# Patient Record
Sex: Female | Born: 1975 | Race: White | Hispanic: Yes | Marital: Married | Smoking: Never smoker
Health system: Southern US, Community
[De-identification: ages and names within clinical notes are randomized; demographics above are authoritative.]

## PROBLEM LIST (undated history)

## (undated) DIAGNOSIS — E785 Hyperlipidemia, unspecified: Secondary | ICD-10-CM

## (undated) DIAGNOSIS — N809 Endometriosis, unspecified: Secondary | ICD-10-CM

## (undated) DIAGNOSIS — D689 Coagulation defect, unspecified: Secondary | ICD-10-CM

## (undated) DIAGNOSIS — K5900 Constipation, unspecified: Secondary | ICD-10-CM

## (undated) DIAGNOSIS — D6852 Prothrombin gene mutation: Secondary | ICD-10-CM

## (undated) DIAGNOSIS — E039 Hypothyroidism, unspecified: Secondary | ICD-10-CM

## (undated) DIAGNOSIS — E559 Vitamin D deficiency, unspecified: Secondary | ICD-10-CM

## (undated) DIAGNOSIS — J309 Allergic rhinitis, unspecified: Secondary | ICD-10-CM

## (undated) HISTORY — DX: Hypothyroidism, unspecified: E03.9

## (undated) HISTORY — DX: Hyperlipidemia, unspecified: E78.5

## (undated) HISTORY — DX: Constipation, unspecified: K59.00

## (undated) HISTORY — DX: Vitamin D deficiency, unspecified: E55.9

## (undated) HISTORY — DX: Endometriosis, unspecified: N80.9

## (undated) HISTORY — DX: Prothrombin gene mutation: D68.52

## (undated) HISTORY — DX: Allergic rhinitis, unspecified: J30.9

## (undated) HISTORY — PX: TUBAL LIGATION: SHX77

---

## 2005-12-22 HISTORY — PX: LAPAROSCOPIC ENDOMETRIOSIS FULGURATION: SUR769

## 2006-02-04 ENCOUNTER — Ambulatory Visit (HOSPITAL_COMMUNITY): Admission: RE | Admit: 2006-02-04 | Discharge: 2006-02-04 | Payer: Self-pay | Admitting: Obstetrics & Gynecology

## 2016-05-07 DIAGNOSIS — Z01419 Encounter for gynecological examination (general) (routine) without abnormal findings: Secondary | ICD-10-CM | POA: Diagnosis not present

## 2016-05-07 DIAGNOSIS — Z1231 Encounter for screening mammogram for malignant neoplasm of breast: Secondary | ICD-10-CM | POA: Diagnosis not present

## 2016-05-07 DIAGNOSIS — Z6825 Body mass index (BMI) 25.0-25.9, adult: Secondary | ICD-10-CM | POA: Diagnosis not present

## 2016-05-21 DIAGNOSIS — S93105A Unspecified dislocation of left toe(s), initial encounter: Secondary | ICD-10-CM | POA: Diagnosis not present

## 2016-05-23 DIAGNOSIS — S92515A Nondisplaced fracture of proximal phalanx of left lesser toe(s), initial encounter for closed fracture: Secondary | ICD-10-CM | POA: Diagnosis not present

## 2016-05-23 DIAGNOSIS — M79675 Pain in left toe(s): Secondary | ICD-10-CM | POA: Diagnosis not present

## 2016-08-13 DIAGNOSIS — K080 Exfoliation of teeth due to systemic causes: Secondary | ICD-10-CM | POA: Diagnosis not present

## 2016-09-02 DIAGNOSIS — Z13220 Encounter for screening for lipoid disorders: Secondary | ICD-10-CM | POA: Diagnosis not present

## 2016-09-02 DIAGNOSIS — Z0000 Encounter for general adult medical examination without abnormal findings: Secondary | ICD-10-CM | POA: Diagnosis not present

## 2016-09-02 DIAGNOSIS — Z23 Encounter for immunization: Secondary | ICD-10-CM | POA: Diagnosis not present

## 2016-09-02 DIAGNOSIS — D649 Anemia, unspecified: Secondary | ICD-10-CM | POA: Diagnosis not present

## 2016-09-02 DIAGNOSIS — E559 Vitamin D deficiency, unspecified: Secondary | ICD-10-CM | POA: Diagnosis not present

## 2017-03-09 DIAGNOSIS — K080 Exfoliation of teeth due to systemic causes: Secondary | ICD-10-CM | POA: Diagnosis not present

## 2017-04-29 DIAGNOSIS — L239 Allergic contact dermatitis, unspecified cause: Secondary | ICD-10-CM | POA: Diagnosis not present

## 2017-05-27 DIAGNOSIS — Z01419 Encounter for gynecological examination (general) (routine) without abnormal findings: Secondary | ICD-10-CM | POA: Diagnosis not present

## 2017-05-27 DIAGNOSIS — Z6826 Body mass index (BMI) 26.0-26.9, adult: Secondary | ICD-10-CM | POA: Diagnosis not present

## 2017-05-27 DIAGNOSIS — Z1231 Encounter for screening mammogram for malignant neoplasm of breast: Secondary | ICD-10-CM | POA: Diagnosis not present

## 2017-05-28 DIAGNOSIS — Z01419 Encounter for gynecological examination (general) (routine) without abnormal findings: Secondary | ICD-10-CM | POA: Diagnosis not present

## 2017-07-24 DIAGNOSIS — R0789 Other chest pain: Secondary | ICD-10-CM | POA: Diagnosis not present

## 2017-07-24 DIAGNOSIS — R61 Generalized hyperhidrosis: Secondary | ICD-10-CM | POA: Diagnosis not present

## 2017-07-30 DIAGNOSIS — R079 Chest pain, unspecified: Secondary | ICD-10-CM | POA: Diagnosis not present

## 2017-07-31 ENCOUNTER — Other Ambulatory Visit: Payer: Self-pay | Admitting: Cardiology

## 2017-07-31 DIAGNOSIS — R079 Chest pain, unspecified: Secondary | ICD-10-CM

## 2017-08-03 DIAGNOSIS — R079 Chest pain, unspecified: Secondary | ICD-10-CM | POA: Diagnosis not present

## 2017-08-06 DIAGNOSIS — R079 Chest pain, unspecified: Secondary | ICD-10-CM | POA: Diagnosis not present

## 2017-08-07 ENCOUNTER — Other Ambulatory Visit: Payer: Self-pay

## 2017-08-07 ENCOUNTER — Ambulatory Visit
Admission: RE | Admit: 2017-08-07 | Discharge: 2017-08-07 | Disposition: A | Discharge status: Home / Self Care | Payer: Federal, State, Local not specified - PPO | Source: Ambulatory Visit | Attending: Cardiology | Admitting: Cardiology

## 2017-08-07 DIAGNOSIS — K8020 Calculus of gallbladder without cholecystitis without obstruction: Secondary | ICD-10-CM | POA: Diagnosis not present

## 2017-08-07 DIAGNOSIS — R079 Chest pain, unspecified: Secondary | ICD-10-CM

## 2017-09-01 ENCOUNTER — Ambulatory Visit: Payer: Self-pay | Admitting: General Surgery

## 2017-09-01 DIAGNOSIS — K8020 Calculus of gallbladder without cholecystitis without obstruction: Secondary | ICD-10-CM | POA: Diagnosis not present

## 2017-09-16 DIAGNOSIS — K080 Exfoliation of teeth due to systemic causes: Secondary | ICD-10-CM | POA: Diagnosis not present

## 2017-09-24 NOTE — Patient Instructions (Signed)
Karina Mitchell  09/24/2017   Your procedure is scheduled on: 10-01-17  Report to [facility] Main  Entrance [place]  elevators to 3rd floor to  [place] at 530AM.   Call this number if you have problems the morning of surgery [phone]    Remember: ONLY 1 PERSON MAY GO WITH YOU TO SHORT STAY TO GET  READY MORNING OF [place].  Do not eat food or drink liquids :After Midnight.     Take these medicines the morning of surgery with A SIP OF WATER: allegra as needed, eye drops                                 You may not have any metal on your body including hair pins and              piercings  Do not wear jewelry, make-up, lotions, powders or perfumes, deodorant             Do not wear nail polish.  Do not shave  48 hours prior to surgery.               Do not bring valuables to the hospital. [place].  Contacts, dentures or bridgework may not be worn into surgery.     Patients discharged the day of surgery will not be allowed to drive home.  Name and phone number of your driver:  Special Instructions: N/A              Please read over the following fact sheets you were given: _____________________________________________________________________           [facility] - Preparing for Surgery Before surgery, you can play an important role.  Because skin is not sterile, your skin needs to be as free of germs as possible.  You can reduce the number of germs on your skin by washing with CHG (chlorahexidine gluconate) soap before surgery.  CHG is an antiseptic cleaner which kills germs and bonds with the skin to continue killing germs even after washing. Please DO NOT use if you have an allergy to CHG or antibacterial soaps.  If your skin becomes reddened/irritated stop using the CHG and inform your nurse when you arrive at Short Stay. Do not shave (including legs and underarms) for at least 48  hours prior to the first CHG shower.  You may shave your face/neck. Please follow these instructions carefully:  1.  Shower with CHG Soap the night before surgery and the  morning of Surgery.  2.  If you choose to wash your hair, wash your hair first as usual with your  normal  shampoo.  3.  After you shampoo, rinse your hair and body thoroughly to remove the  shampoo.                           4.  Use CHG as you would any other liquid soap.  You can apply chg directly  to the skin and wash                       Gently with a scrungie or clean washcloth.  5.  Apply the CHG Soap to your body ONLY FROM THE NECK DOWN.   Do not use on face/ open                           Wound or open sores. Avoid contact with eyes, ears mouth and genitals (private parts).                       Wash face,  Genitals (private parts) with your normal soap.             6.  Wash thoroughly, paying special attention to the area where your surgery  will be performed.  7.  Thoroughly rinse your body with warm water from the neck down.  8.  DO NOT shower/wash with your normal soap after using and rinsing off  the CHG Soap.                9.  Pat yourself dry with a clean towel.            10.  Wear clean pajamas.            11.  Place clean sheets on your bed the night of your first shower and do not  sleep with pets. Day of Surgery : Do not apply any lotions/deodorants the morning of surgery.  Please wear clean clothes to the hospital/surgery center.  FAILURE TO FOLLOW THESE INSTRUCTIONS MAY RESULT IN THE CANCELLATION OF YOUR SURGERY PATIENT SIGNATURE_________________________________  NURSE SIGNATURE__________________________________  ________________________________________________________________________

## 2017-09-24 NOTE — Progress Notes (Addendum)
[place] cardiology Dr Wynonia Lawman 08-06-17 on chart   [place] Dr Wynonia Lawman 07-30-17 on chart   EKG 07-30-17 on chart from Dr Wynonia Lawman office   Stress test 08-06-17 on chart from Dr Wynonia Lawman office   ECHO 08-03-17 on chart from Dr Wynonia Lawman office

## 2017-09-28 ENCOUNTER — Encounter (HOSPITAL_COMMUNITY)
Admission: RE | Admit: 2017-09-28 | Discharge: 2017-09-28 | Disposition: A | Discharge status: Home / Self Care | Payer: Federal, State, Local not specified - PPO | Source: Ambulatory Visit | Attending: General Surgery | Admitting: General Surgery

## 2017-09-28 ENCOUNTER — Encounter (HOSPITAL_COMMUNITY): Payer: Self-pay | Admitting: Emergency Medicine

## 2017-09-28 DIAGNOSIS — K8080 Other cholelithiasis without obstruction: Secondary | ICD-10-CM | POA: Diagnosis not present

## 2017-09-28 DIAGNOSIS — K8010 Calculus of gallbladder with chronic cholecystitis without obstruction: Secondary | ICD-10-CM | POA: Diagnosis not present

## 2017-09-28 HISTORY — DX: Coagulation defect, unspecified: D68.9

## 2017-09-28 LAB — CBC
HCT: 34 % — ABNORMAL LOW (ref 36.0–46.0)
HEMOGLOBIN: 11.3 g/dL — AB (ref 12.0–15.0)
MCH: 29 pg (ref 26.0–34.0)
MCHC: 33.2 g/dL (ref 30.0–36.0)
MCV: 87.4 fL (ref 78.0–100.0)
Platelets: 273 10*3/uL (ref 150–400)
RBC: 3.89 MIL/uL (ref 3.87–5.11)
RDW: 13.9 % (ref 11.5–15.5)
WBC: 5.5 10*3/uL (ref 4.0–10.5)

## 2017-09-28 LAB — HCG, SERUM, QUALITATIVE: PREG SERUM: NEGATIVE

## 2017-09-30 NOTE — Anesthesia Preprocedure Evaluation (Signed)
Anesthesia Evaluation  Patient identified by MRN, date of birth, ID band Patient awake    Reviewed: Allergy & Precautions, NPO status , Patient's Chart, lab work & pertinent test results  Airway Mallampati: II  TM Distance: >3 FB Neck ROM: Full    Dental no notable dental hx.    Pulmonary neg pulmonary ROS,    Pulmonary exam normal breath sounds clear to auscultation       Cardiovascular negative cardio ROS Normal cardiovascular exam Rhythm:Regular Rate:Normal     Neuro/Psych negative neurological ROS  negative psych ROS   GI/Hepatic negative GI ROS, Neg liver ROS,   Endo/Other  negative endocrine ROS  Renal/GU negative Renal ROS  negative genitourinary   Musculoskeletal negative musculoskeletal ROS (+)   Abdominal   Peds negative pediatric ROS (+)  Hematology negative hematology ROS (+)   Anesthesia Other Findings   Reproductive/Obstetrics negative OB ROS                             Anesthesia Physical Anesthesia Plan  ASA: II  Anesthesia Plan: General   Post-op Pain Management:    Induction: Intravenous  PONV Risk Score and Plan: 3 and Ondansetron, Dexamethasone, Midazolam, Treatment may vary due to age or medical condition and Scopolamine patch - Pre-op  Airway Management Planned: Oral ETT  Additional Equipment:   Intra-op Plan:   Post-operative Plan: Extubation in OR  Informed Consent:   Plan Discussed with:   Anesthesia Plan Comments: (  )        Anesthesia Quick Evaluation

## 2017-10-01 ENCOUNTER — Ambulatory Visit (HOSPITAL_COMMUNITY): Payer: Federal, State, Local not specified - PPO | Admitting: Anesthesiology

## 2017-10-01 ENCOUNTER — Encounter (HOSPITAL_COMMUNITY)
Admission: RE | Disposition: A | Discharge status: Home / Self Care | Payer: Self-pay | Source: Ambulatory Visit | Attending: General Surgery

## 2017-10-01 ENCOUNTER — Encounter (HOSPITAL_COMMUNITY): Payer: Self-pay | Admitting: *Deleted

## 2017-10-01 ENCOUNTER — Ambulatory Visit (HOSPITAL_COMMUNITY): Payer: Federal, State, Local not specified - PPO

## 2017-10-01 ENCOUNTER — Ambulatory Visit (HOSPITAL_COMMUNITY)
Admission: RE | Admit: 2017-10-01 | Discharge: 2017-10-01 | Disposition: A | Discharge status: Home / Self Care | Payer: Federal, State, Local not specified - PPO | Source: Ambulatory Visit | Attending: General Surgery | Admitting: General Surgery

## 2017-10-01 DIAGNOSIS — K8020 Calculus of gallbladder without cholecystitis without obstruction: Secondary | ICD-10-CM | POA: Diagnosis not present

## 2017-10-01 DIAGNOSIS — K8010 Calculus of gallbladder with chronic cholecystitis without obstruction: Secondary | ICD-10-CM | POA: Insufficient documentation

## 2017-10-01 DIAGNOSIS — K824 Cholesterolosis of gallbladder: Secondary | ICD-10-CM | POA: Diagnosis not present

## 2017-10-01 DIAGNOSIS — K8080 Other cholelithiasis without obstruction: Secondary | ICD-10-CM | POA: Diagnosis not present

## 2017-10-01 HISTORY — PX: CHOLECYSTECTOMY: SHX55

## 2017-10-01 SURGERY — LAPAROSCOPIC CHOLECYSTECTOMY WITH INTRAOPERATIVE CHOLANGIOGRAM
Anesthesia: General | Site: Abdomen

## 2017-10-01 MED ORDER — ONDANSETRON HCL 4 MG/2ML IJ SOLN
INTRAMUSCULAR | Status: DC | PRN
  Administered 2017-10-01 (×2): 2 mg via INTRAVENOUS

## 2017-10-01 MED ORDER — HYDROCODONE-ACETAMINOPHEN 5-325 MG PO TABS: 1.0000 | ORAL_TABLET | Freq: Four times a day (QID) | ORAL | Status: DC | PRN

## 2017-10-01 MED ORDER — ACETAMINOPHEN 500 MG PO TABS
1000.0000 mg | ORAL_TABLET | ORAL | Status: AC
  Administered 2017-10-01: 1000 mg via ORAL
  Filled 2017-10-01: qty 2

## 2017-10-01 MED ORDER — METOCLOPRAMIDE HCL 5 MG/ML IJ SOLN
INTRAMUSCULAR | Status: DC | PRN
  Administered 2017-10-01: 5 mg via INTRAVENOUS

## 2017-10-01 MED ORDER — DEXAMETHASONE SODIUM PHOSPHATE 10 MG/ML IJ SOLN
INTRAMUSCULAR | Status: AC
  Filled 2017-10-01: qty 1

## 2017-10-01 MED ORDER — METOCLOPRAMIDE HCL 5 MG/ML IJ SOLN
INTRAMUSCULAR | Status: AC
  Filled 2017-10-01: qty 2

## 2017-10-01 MED ORDER — SUGAMMADEX SODIUM 500 MG/5ML IV SOLN
INTRAVENOUS | Status: DC | PRN
  Administered 2017-10-01: 300 mg via INTRAVENOUS

## 2017-10-01 MED ORDER — SCOPOLAMINE 1 MG/3DAYS TD PT72
MEDICATED_PATCH | TRANSDERMAL | Status: AC
  Filled 2017-10-01: qty 1

## 2017-10-01 MED ORDER — CEFAZOLIN SODIUM-DEXTROSE 2-4 GM/100ML-% IV SOLN
INTRAVENOUS | Status: AC
  Filled 2017-10-01: qty 100

## 2017-10-01 MED ORDER — IOPAMIDOL (ISOVUE-300) INJECTION 61%
INTRAVENOUS | Status: DC | PRN
  Administered 2017-10-01: 11 mL

## 2017-10-01 MED ORDER — LIDOCAINE HCL (CARDIAC) 20 MG/ML IV SOLN
INTRAVENOUS | Status: DC | PRN
  Administered 2017-10-01: 80 mg via INTRAVENOUS

## 2017-10-01 MED ORDER — DEXAMETHASONE SODIUM PHOSPHATE 10 MG/ML IJ SOLN
INTRAMUSCULAR | Status: DC | PRN
  Administered 2017-10-01: 10 mg via INTRAVENOUS

## 2017-10-01 MED ORDER — PROPOFOL 10 MG/ML IV BOLUS
INTRAVENOUS | Status: DC | PRN
  Administered 2017-10-01: 150 mg via INTRAVENOUS
  Administered 2017-10-01: 50 mg via INTRAVENOUS

## 2017-10-01 MED ORDER — LACTATED RINGERS IV SOLN
INTRAVENOUS | Status: DC | PRN
  Administered 2017-10-01 (×2): via INTRAVENOUS

## 2017-10-01 MED ORDER — FENTANYL CITRATE (PF) 100 MCG/2ML IJ SOLN
INTRAMUSCULAR | Status: DC | PRN
  Administered 2017-10-01: 50 ug via INTRAVENOUS
  Administered 2017-10-01: 100 ug via INTRAVENOUS
  Administered 2017-10-01: 50 ug via INTRAVENOUS

## 2017-10-01 MED ORDER — ROCURONIUM BROMIDE 50 MG/5ML IV SOSY
PREFILLED_SYRINGE | INTRAVENOUS | Status: AC
  Filled 2017-10-01: qty 5

## 2017-10-01 MED ORDER — MIDAZOLAM HCL 2 MG/2ML IJ SOLN
INTRAMUSCULAR | Status: AC
  Filled 2017-10-01: qty 2

## 2017-10-01 MED ORDER — FENTANYL CITRATE (PF) 100 MCG/2ML IJ SOLN
INTRAMUSCULAR | Status: AC
  Filled 2017-10-01: qty 2

## 2017-10-01 MED ORDER — ACETAMINOPHEN 10 MG/ML IV SOLN
INTRAVENOUS | Status: AC
  Filled 2017-10-01: qty 100

## 2017-10-01 MED ORDER — CHLORHEXIDINE GLUCONATE CLOTH 2 % EX PADS: 6.0000 | MEDICATED_PAD | Freq: Once | CUTANEOUS | Status: DC

## 2017-10-01 MED ORDER — HYDROCODONE-ACETAMINOPHEN 5-325 MG PO TABS: 1.0000 | ORAL_TABLET | Freq: Four times a day (QID) | ORAL | 0 refills | Status: DC | PRN

## 2017-10-01 MED ORDER — BUPIVACAINE-EPINEPHRINE 0.25% -1:200000 IJ SOLN
INTRAMUSCULAR | Status: DC | PRN
Start: 2017-10-01 — End: 2017-10-01
  Administered 2017-10-01: 22 mL

## 2017-10-01 MED ORDER — ONDANSETRON HCL 4 MG/2ML IJ SOLN
INTRAMUSCULAR | Status: AC
  Filled 2017-10-01: qty 2

## 2017-10-01 MED ORDER — GABAPENTIN 300 MG PO CAPS
300.0000 mg | ORAL_CAPSULE | ORAL | Status: AC
Start: 2017-10-01 — End: 2017-10-01
  Administered 2017-10-01: 300 mg via ORAL
  Filled 2017-10-01: qty 1

## 2017-10-01 MED ORDER — BUPIVACAINE-EPINEPHRINE (PF) 0.25% -1:200000 IJ SOLN
INTRAMUSCULAR | Status: AC
  Filled 2017-10-01: qty 30

## 2017-10-01 MED ORDER — GLYCOPYRROLATE 0.2 MG/ML IV SOSY
PREFILLED_SYRINGE | INTRAVENOUS | Status: AC
  Filled 2017-10-01: qty 5

## 2017-10-01 MED ORDER — SCOPOLAMINE 1 MG/3DAYS TD PT72
MEDICATED_PATCH | TRANSDERMAL | Status: DC | PRN
  Administered 2017-10-01 (×2): 1 via TRANSDERMAL

## 2017-10-01 MED ORDER — ONDANSETRON HCL 4 MG/2ML IJ SOLN: 4.0000 mg | Freq: Once | INTRAMUSCULAR | Status: DC | PRN

## 2017-10-01 MED ORDER — MIDAZOLAM HCL 5 MG/5ML IJ SOLN
INTRAMUSCULAR | Status: DC | PRN
  Administered 2017-10-01 (×2): 1 mg via INTRAVENOUS

## 2017-10-01 MED ORDER — SUGAMMADEX SODIUM 200 MG/2ML IV SOLN
INTRAVENOUS | Status: AC
  Filled 2017-10-01: qty 2

## 2017-10-01 MED ORDER — EPHEDRINE SULFATE 50 MG/ML IJ SOLN
INTRAMUSCULAR | Status: DC | PRN
  Administered 2017-10-01: 5 mg via INTRAVENOUS

## 2017-10-01 MED ORDER — MEPERIDINE HCL 50 MG/ML IJ SOLN: 6.2500 mg | INTRAMUSCULAR | Status: DC | PRN

## 2017-10-01 MED ORDER — IOPAMIDOL (ISOVUE-300) INJECTION 61%
INTRAVENOUS | Status: AC
  Filled 2017-10-01: qty 50

## 2017-10-01 MED ORDER — CEFAZOLIN SODIUM-DEXTROSE 2-4 GM/100ML-% IV SOLN
2.0000 g | INTRAVENOUS | Status: AC
  Administered 2017-10-01: 2 g via INTRAVENOUS

## 2017-10-01 MED ORDER — PROPOFOL 10 MG/ML IV BOLUS
INTRAVENOUS | Status: AC
  Filled 2017-10-01: qty 20

## 2017-10-01 MED ORDER — EPHEDRINE 5 MG/ML INJ
INTRAVENOUS | Status: AC
  Filled 2017-10-01: qty 10

## 2017-10-01 MED ORDER — FENTANYL CITRATE (PF) 100 MCG/2ML IJ SOLN: 25.0000 ug | INTRAMUSCULAR | Status: DC | PRN

## 2017-10-01 MED ORDER — 0.9 % SODIUM CHLORIDE (POUR BTL) OPTIME
TOPICAL | Status: DC | PRN
  Administered 2017-10-01: 1000 mL

## 2017-10-01 MED ORDER — CELECOXIB 200 MG PO CAPS
400.0000 mg | ORAL_CAPSULE | ORAL | Status: AC
  Administered 2017-10-01: 400 mg via ORAL
  Filled 2017-10-01: qty 2

## 2017-10-01 MED ORDER — LACTATED RINGERS IR SOLN
Status: DC | PRN
  Administered 2017-10-01: 1000 mL

## 2017-10-01 MED ORDER — LIDOCAINE 2% (20 MG/ML) 5 ML SYRINGE
INTRAMUSCULAR | Status: AC
  Filled 2017-10-01: qty 5

## 2017-10-01 MED ORDER — GLYCOPYRROLATE 0.2 MG/ML IJ SOLN
INTRAMUSCULAR | Status: DC | PRN
  Administered 2017-10-01: 0.2 mg via INTRAVENOUS

## 2017-10-01 MED ORDER — ROCURONIUM BROMIDE 100 MG/10ML IV SOLN
INTRAVENOUS | Status: DC | PRN
  Administered 2017-10-01: 30 mg via INTRAVENOUS
  Administered 2017-10-01: 10 mg via INTRAVENOUS

## 2017-10-01 MED FILL — HYDROCODON-APAP 5-325: 5-325 | 2 days supply | Qty: 15 | Fill #0

## 2017-10-01 SURGICAL SUPPLY — 27 items
APPLIER CLIP 5 13 M/L LIGAMAX5 (MISCELLANEOUS) ×2
CABLE HIGH FREQUENCY MONO STRZ (ELECTRODE) ×2 IMPLANT
CATH REDDICK CHOLANGI 4FR 50CM (CATHETERS) ×2 IMPLANT
CHLORAPREP W/TINT 26ML (MISCELLANEOUS) ×2 IMPLANT
CLIP APPLIE 5 13 M/L LIGAMAX5 (MISCELLANEOUS) ×1 IMPLANT
COVER MAYO STAND STRL (DRAPES) ×2 IMPLANT
DECANTER SPIKE VIAL GLASS SM (MISCELLANEOUS) ×2 IMPLANT
DERMABOND ADVANCED (GAUZE/BANDAGES/DRESSINGS) ×1
DERMABOND ADVANCED .7 DNX12 (GAUZE/BANDAGES/DRESSINGS) ×1 IMPLANT
DRAPE C-ARM 42X120 X-RAY (DRAPES) ×2 IMPLANT
ELECT REM PT RETURN 15FT ADLT (MISCELLANEOUS) ×2 IMPLANT
GLOVE BIO SURGEON STRL SZ7.5 (GLOVE) ×2 IMPLANT
GOWN STRL REUS W/TWL XL LVL3 (GOWN DISPOSABLE) ×8 IMPLANT
HEMOSTAT SURGICEL 4X8 (HEMOSTASIS) IMPLANT
IV CATH 14GX2 1/4 (CATHETERS) ×2 IMPLANT
KIT BASIN OR (CUSTOM PROCEDURE TRAY) ×2 IMPLANT
POUCH SPECIMEN RETRIEVAL 10MM (ENDOMECHANICALS) ×2 IMPLANT
SCISSORS LAP 5X35 DISP (ENDOMECHANICALS) ×2 IMPLANT
SET IRRIG TUBING LAPAROSCOPIC (IRRIGATION / IRRIGATOR) ×2 IMPLANT
SLEEVE XCEL OPT CAN 5 100 (ENDOMECHANICALS) ×6 IMPLANT
SUT MNCRL AB 4-0 PS2 18 (SUTURE) ×2 IMPLANT
TOWEL OR 17X26 10 PK STRL BLUE (TOWEL DISPOSABLE) ×2 IMPLANT
TOWEL OR NON WOVEN STRL DISP B (DISPOSABLE) ×2 IMPLANT
TRAY LAPAROSCOPIC (CUSTOM PROCEDURE TRAY) ×2 IMPLANT
TROCAR BLADELESS OPT 5 100 (ENDOMECHANICALS) ×2 IMPLANT
TROCAR XCEL BLUNT TIP 100MML (ENDOMECHANICALS) ×2 IMPLANT
TUBING INSUF HEATED (TUBING) ×2 IMPLANT

## 2017-10-01 NOTE — Anesthesia Procedure Notes (Signed)
Procedure Name: Intubation Date/Time: 10/01/2017 7:41 AM Performed by: Janeece Riggers Pre-anesthesia Checklist: Emergency Drugs available, Suction available, Patient identified, Patient being monitored and Timeout performed Patient Re-evaluated:Patient Re-evaluated prior to induction Oxygen Delivery Method: Circle system utilized Preoxygenation: Pre-oxygenation with 100% oxygen Induction Type: IV induction and Cricoid Pressure applied Ventilation: Mask ventilation without difficulty Laryngoscope Size: Mac and 3 Grade View: Grade I Tube type: Oral Tube size: 7.0 mm Number of attempts: 1 Placement Confirmation: ETT inserted through vocal cords under direct vision,  breath sounds checked- equal and bilateral and positive ETCO2 Secured at: 21 cm Tube secured with: Tape Dental Injury: Teeth and Oropharynx as per pre-operative assessment

## 2017-10-01 NOTE — Discharge Instructions (Signed)
Scopolamine skin patches What is this medicine? SCOPOLAMINE (skoe POL a meen) is used to prevent nausea and vomiting caused by motion sickness, anesthesia and surgery. This medicine may be used for other purposes; ask your health care provider or pharmacist if you have questions. COMMON BRAND NAME(S): Transderm Scop What should I tell my health care provider before I take this medicine? They need to know if you have any of these conditions: -glaucoma -kidney or liver disease -an unusual or allergic reaction (especially skin allergy) to scopolamine, atropine, other medicines, foods, dyes, or preservatives -pregnant or trying to get pregnant -breast-feeding How should I use this medicine? This medicine is for external use only. Follow the directions on the prescription label. One patch contains enough medicine to prevent motion sickness for up to 3 days. Apply the patch at least 4 hours before you need it and only wear one disc at a time. Choose an area behind the ear, that is clean, dry, hairless and free from any cuts or irritation. Wipe the area with a clean dry tissue. Peel off the plastic backing of the skin patch, trying not to touch the adhesive side with your hands. Do not cut the patches. Firmly apply to the area you have chosen, with the metallic side of the patch to the skin and the tan-colored side showing. Once firmly in place, wash your hands well with soap and water. Remove the disc after 3 days, or sooner if you no longer need it. After removing the patch, wash your hands and the area behind your ear thoroughly with soap and water. The patch will still contain some medicine after use. To avoid accidental contact or ingestion by children or pets, fold the used patch in half with the sticky side together and throw away in the trash out of the reach of children and pets. If you need to use a second patch after you remove the first, place it behind the other ear. Talk to your pediatrician  regarding the use of this medicine in children. Special care may be needed. Overdosage: If you think you have taken too much of this medicine contact a poison control center or emergency room at once. NOTE: This medicine is only for you. Do not share this medicine with others. What if I miss a dose? Make sure you apply the patch at least 4 hours before you need it. You can apply it the night before traveling. What may interact with this medicine? -benztropine -bethanechol -medicines for anxiety or sleeping problems like diazepam or temazepam -medicines for hay fever and other allergies -medicines for mental depression -muscle relaxants This list may not describe all possible interactions. Give your health care provider a list of all the medicines, herbs, non-prescription drugs, or dietary supplements you use. Also tell them if you smoke, drink alcohol, or use illegal drugs. Some items may interact with your medicine. What should I watch for while using this medicine? Keep the patch dry, if possible, to prevent it from falling off. Limited contact with water, however, as in bathing or swimming, will not affect the system. If the patch falls off, throw it away and put a new one behind the other ear. You may get drowsy or dizzy. Do not drive, use machinery, or do anything that needs mental alertness until you know how this medicine affects you. Do not stand or sit up quickly, especially if you are an older patient. This reduces the risk of dizzy or fainting spells. Alcohol may interfere  with the effect of this medicine. Avoid alcoholic drinks. Your mouth may get dry. Chewing sugarless gum or sucking hard candy, and drinking plenty of water may help. Contact your doctor if the problem does not go away or is severe. This medicine may cause dry eyes and blurred vision. If you wear contact lenses you may feel some discomfort. Lubricating drops may help. See your eye doctor if the problem does not go away  or is severe. If you are going to have a magnetic resonance imaging (MRI) procedure, tell your MRI technician if you have this patch on your body. It must be removed before a MRI. What side effects may I notice from receiving this medicine? Side effects that you should report to your doctor or health care professional as soon as possible: -agitation, nervousness, confusion -blurred vision and other eye problems -dizziness, drowsiness -eye pain or redness in the whites of the eye -hallucinations -pain or difficulty passing urine -skin rash, itching -vomiting Side effects that usually do not require medical attention (report to your doctor or health care professional if they continue or are bothersome): -headache -nausea This list may not describe all possible side effects. Call your doctor for medical advice about side effects. You may report side effects to FDA at 1-800-FDA-1088. Where should I keep my medicine? Keep out of the reach of children. Store at room temperature between 20 and 25 degrees C (68 and 77 degrees F). Throw away any unused medicine after the expiration date. When you remove a patch, fold it and throw it in the trash as described above. NOTE: This sheet is a summary. It may not cover all possible information. If you have questions about this medicine, talk to your doctor, pharmacist, or health care provider.  2018 Elsevier/Gold Standard (2012-05-06 13:31:48)   General Anesthesia, Adult, Care After These instructions provide you with information about caring for yourself after your procedure. Your health care provider may also give you more specific instructions. Your treatment has been planned according to current medical practices, but problems sometimes occur. Call your health care provider if you have any problems or questions after your procedure. What can I expect after the procedure? After the procedure, it is common to have:  Vomiting.  A sore  throat.  Mental slowness.  It is common to feel:  Nauseous.  Cold or shivery.  Sleepy.  Tired.  Sore or achy, even in parts of your body where you did not have surgery.  Follow these instructions at home: For at least 24 hours after the procedure:  Do not: ? Participate in activities where you could fall or become injured. ? Drive. ? Use heavy machinery. ? Drink alcohol. ? Take sleeping pills or medicines that cause drowsiness. ? Make important decisions or sign legal documents. ? Take care of children on your own.  Rest. Eating and drinking  If you vomit, drink water, juice, or soup when you can drink without vomiting.  Drink enough fluid to keep your urine clear or pale yellow.  Make sure you have little or no nausea before eating solid foods.  Follow the diet recommended by your health care provider. General instructions  Have a responsible adult stay with you until you are awake and alert.  Return to your normal activities as told by your health care provider. Ask your health care provider what activities are safe for you.  Take over-the-counter and prescription medicines only as told by your health care provider.  If you smoke,  do not smoke without supervision.  Keep all follow-up visits as told by your health care provider. This is important. Contact a health care provider if:  You continue to have nausea or vomiting at home, and medicines are not helpful.  You cannot drink fluids or start eating again.  You cannot urinate after 8-12 hours.  You develop a skin rash.  You have fever.  You have increasing redness at the site of your procedure. Get help right away if:  You have difficulty breathing.  You have chest pain.  You have unexpected bleeding.  You feel that you are having a life-threatening or urgent problem. This information is not intended to replace advice given to you by your health care provider. Make sure you discuss any  questions you have with your health care provider. Document Released: 03/16/2001 Document Revised: 05/12/2016 Document Reviewed: 11/22/2015 Elsevier Interactive Patient Education  [employer].

## 2017-10-01 NOTE — Interval H&P Note (Signed)
History and Physical Interval Note:  10/01/2017 7:32 AM  Karina Mitchell  has presented today for surgery, with the diagnosis of GALLSTONES  The various methods of treatment have been discussed with the patient and family. After consideration of risks, benefits and other options for treatment, the patient has consented to  Procedure(s): LAPAROSCOPIC CHOLECYSTECTOMY WITH INTRAOPERATIVE CHOLANGIOGRAM (N/A) as a surgical intervention .  The patient's history has been reviewed, patient examined, no change in status, stable for surgery.  I have reviewed the patient's chart and labs.  Questions were answered to the patient's satisfaction.     TOTH III,PAUL S

## 2017-10-01 NOTE — H&P (Signed)
Karina Mitchell  Location: [facility] Surgery Patient #: [id number] DOB: 1976/07/07 Married / Language: English / Race: White Female   History of Present Illness  The patient is a 41 year old female who presents with abdominal pain. We are asked to see the patient in consultation by Dr. Marilynne Drivers to evaluate her for gallstones. The patient is a 41 year old white female who has had 3 episodes of severe epigastric pain in the last year. The most recent episode occurred on August 3. The pain is always in the epigastric and substernal region. The last time she had this it lasted about an hour before resolving. The pain was associated with some nausea but no vomiting. After this she underwent an ultrasound which did show multiple stones in the gallbladder but no gallbladder wall thickening or ductal dilatation. Her liver functions were normal.   Past Surgical History Cesarean Section - Multiple   Diagnostic Studies History  Colonoscopy  never Mammogram  within last year Pap Smear  1-5 years ago  Allergies  No Known Drug Allergies   Medication History  Naproxen (500MG  Tablet, Oral) Active. Allegra Allergy (Oral) Specific strength unknown - Active. Medications Reconciled  Social History Alcohol use  Occasional alcohol use. Caffeine use  Carbonated beverages, Coffee, Tea. No drug use  Tobacco use  Never smoker.  Family History Alcohol Abuse  Brother, Father. Cerebrovascular Accident  Father. Colon Polyps  Son. Hypertension  Father, Sister. Migraine Headache  Sister.  Pregnancy / Birth History Age at menarche  1 years. Contraceptive History  Oral contraceptives. Gravida  2 Length (months) of breastfeeding  12-24 Maternal age  11-35 Para  2 Regular periods   Other Problems Cholelithiasis  Hemorrhoids     Review of Systems General Not Present- Appetite Loss, Chills, Fatigue, Fever, Night Sweats, Weight Gain and Weight Loss. Skin  Not Present- Change in Wart/Mole, Dryness, Hives, Jaundice, New Lesions, Non-Healing Wounds, Rash and Ulcer. HEENT Present- Wears glasses/contact lenses. Not Present- Earache, Hearing Loss, Hoarseness, Nose Bleed, Oral Ulcers, Ringing in the Ears, Seasonal Allergies, Sinus Pain, Sore Throat, Visual Disturbances and Yellow Eyes. Respiratory Not Present- Bloody sputum, Chronic Cough, Difficulty Breathing, Snoring and Wheezing. Cardiovascular Not Present- Chest Pain, Difficulty Breathing Lying Down, Leg Cramps, Palpitations, Rapid Heart Rate, Shortness of Breath and Swelling of Extremities. Gastrointestinal Present- Abdominal Pain. Not Present- Bloating, Bloody Stool, Change in Bowel Habits, Chronic diarrhea, Constipation, Difficulty Swallowing, Excessive gas, Gets full quickly at meals, Hemorrhoids, Indigestion, Nausea, Rectal Pain and Vomiting. Female Genitourinary Not Present- Frequency, Nocturia, Painful Urination, Pelvic Pain and Urgency. Musculoskeletal Not Present- Back Pain, Joint Pain, Joint Stiffness, Muscle Pain, Muscle Weakness and Swelling of Extremities. Neurological Not Present- Decreased Memory, Fainting, Headaches, Numbness, Seizures, Tingling, Tremor, Trouble walking and Weakness. Psychiatric Not Present- Anxiety, Bipolar, Change in Sleep Pattern, Depression, Fearful and Frequent crying. Endocrine Not Present- Cold Intolerance, Excessive Hunger, Hair Changes, Heat Intolerance, Hot flashes and New Diabetes. Hematology Not Present- Blood Thinners, Easy Bruising, Excessive bleeding, Gland problems, HIV and Persistent Infections.  Vitals Weight: 140.38 lb Height: 61in Body Surface Area: 1.63 m Body Mass Index: 26.52 kg/m  Temp.: 98.66F(Oral)  Pulse: 64 (Regular)  BP: 98/74 (Sitting, Left Arm, Standard)       Physical Exam  General Mental Status-Alert. General Appearance-Consistent with stated age. Hydration-Well hydrated. Voice-Normal.  Head and  Neck Head-normocephalic, atraumatic with no lesions or palpable masses. Trachea-midline. Thyroid Gland Characteristics - normal size and consistency.  Eye Eyeball - Bilateral-Extraocular movements intact. Sclera/Conjunctiva - Bilateral-No  scleral icterus.  Chest and Lung Exam Chest and lung exam reveals -quiet, even and easy respiratory effort with no use of accessory muscles and on auscultation, normal breath sounds, no adventitious sounds and normal vocal resonance. Inspection Chest Wall - Normal. Back - normal.  Cardiovascular Cardiovascular examination reveals -normal heart sounds, regular rate and rhythm with no murmurs and normal pedal pulses bilaterally.  Abdomen Inspection Inspection of the abdomen reveals - No Hernias. Skin - Scar - no surgical scars. Palpation/Percussion Palpation and Percussion of the abdomen reveal - Soft, Non Tender, No Rebound tenderness, No Rigidity (guarding) and No hepatosplenomegaly. Auscultation Auscultation of the abdomen reveals - Bowel sounds normal.  Neurologic Neurologic evaluation reveals -alert and oriented x 3 with no impairment of recent or remote memory. Mental Status-Normal.  Musculoskeletal Normal Exam - Left-Upper Extremity Strength Normal and Lower Extremity Strength Normal. Normal Exam - Right-Upper Extremity Strength Normal and Lower Extremity Strength Normal.  Lymphatic Head & Neck  General Head & Neck Lymphatics: Bilateral - Description - Normal. Axillary  General Axillary Region: Bilateral - Description - Normal. Tenderness - Non Tender. Femoral & Inguinal  Generalized Femoral & Inguinal Lymphatics: Bilateral - Description - Normal. Tenderness - Non Tender.    Assessment & Plan GALLSTONES (K80.20) Impression: The patient appears to have symptomatic gallstones. Because of the risk of further painful episodes and possible pancreatitis and think she would benefit from having her gallbladder  removed. She would also like to have this done. I have discussed with her in detail the risks and benefits of the operation as well as some of the technical aspects and she understands and wishes to proceed Current Plans Pt Education - Gallstones: discussed with patient and provided information.

## 2017-10-01 NOTE — Transfer of Care (Signed)
Immediate Anesthesia Transfer of Care Note  Patient: Karina Mitchell  Procedure(s) Performed: LAPAROSCOPIC CHOLECYSTECTOMY WITH INTRAOPERATIVE CHOLANGIOGRAM (N/A Abdomen)  Patient Location: PACU  Anesthesia Type:General  Level of Consciousness: awake, sedated, patient cooperative, lethargic and responds to stimulation  Airway & Oxygen Therapy: Patient Spontanous Breathing and Patient connected to face mask oxygen  Post-op Assessment: Report given to RN, Post -op Vital signs reviewed and stable and Patient moving all extremities  Post vital signs: Reviewed and stable  Last Vitals:  Vitals:   10/01/17 0531 10/01/17 0910  BP: 115/72 (!) 110/58  Pulse: 70 94  Resp: 16   Temp: 36.8 C   SpO2: 99% 100%    Last Pain:  Vitals:   10/01/17 0531  TempSrc: Oral      Patients Stated Pain Goal: 4 (61/95/09 [id number])  Complications: No apparent anesthesia complications

## 2017-10-01 NOTE — Op Note (Signed)
10/01/2017  9:03 AM  PATIENT:  Karina Mitchell  41 y.o. female  PRE-OPERATIVE DIAGNOSIS:  GALLSTONES  POST-OPERATIVE DIAGNOSIS:  GALLSTONES  PROCEDURE:  Procedure(s): LAPAROSCOPIC CHOLECYSTECTOMY WITH INTRAOPERATIVE CHOLANGIOGRAM (N/A)  SURGEON:  Surgeon(s) and Role:    * Jovita Kussmaul, MD - Primary  PHYSICIAN ASSISTANT:   ASSISTANTS: none   ANESTHESIA:   local and general  EBL:  No intake/output data recorded.  BLOOD ADMINISTERED:none  DRAINS: none   LOCAL MEDICATIONS USED:  MARCAINE     SPECIMEN:  Source of Specimen:  gallbladder  DISPOSITION OF SPECIMEN:  PATHOLOGY  COUNTS:  YES  TOURNIQUET:  * No tourniquets in log *  DICTATION: .Dragon Dictation   Procedure: After informed consent was obtained the patient was brought to the operating room and placed in the supine position on the operating room table. After adequate induction of general anesthesia the patient's abdomen was prepped with ChloraPrep allowed to dry and draped in usual sterile manner. An appropriate timeout was performed. The area below the umbilicus was infiltrated with quarter percent  Marcaine. A small incision was made with a 15 blade knife. The incision was carried down through the subcutaneous tissue bluntly with a hemostat and [employer] retractors. The linea alba was identified. The linea alba was incised with a 15 blade knife and each side was grasped with Coker clamps. The preperitoneal space was then probed with a hemostat until the peritoneum was opened and access was gained to the abdominal cavity. A 0 Vicryl pursestring stitch was placed in the fascia surrounding the opening. A Hassan cannula was then placed through the opening and anchored in place with the previously placed Vicryl purse string stitch. The abdomen was insufflated with carbon dioxide without difficulty. A laparoscope was inserted through the [facility] cannula in the right upper quadrant was inspected. Next the epigastric region was  infiltrated with % Marcaine. A small incision was made with a 15 blade knife. A 5 mm port was placed bluntly through this incision into the abdominal cavity under direct vision. Next 2 sites were chosen laterally on the right side of the abdomen for placement of 5 mm ports. Each of these areas was infiltrated with quarter percent Marcaine. Small stab incisions were made with a 15 blade knife. 5 mm ports were then placed bluntly through these incisions into the abdominal cavity under direct vision without difficulty. A blunt grasper was placed through the lateralmost 5 mm port and used to grasp the dome of the gallbladder and elevated anteriorly and superiorly. Another blunt grasper was placed through the other 5 mm port and used to retract the body and neck of the gallbladder. A dissector was placed through the epigastric port and using the electrocautery the peritoneal reflection at the gallbladder neck was opened. Blunt dissection was then carried out in this area until the gallbladder neck-cystic duct junction was readily identified and a good window was created. A single clip was placed on the gallbladder neck. A small  ductotomy was made just below the clip with laparoscopic scissors. A 14-gauge Angiocath was then placed through the anterior abdominal wall under direct vision. A Reddick cholangiogram catheter was then placed through the Angiocath and flushed. The catheter was then placed in the cystic duct and anchored in place with a clip. A cholangiogram was obtained that showed no filling defects good emptying into the duodenum an adequate length on the cystic duct. The anchoring clip and catheters were then removed from the patient. 3 clips were  placed proximally on the cystic duct and the duct was divided between the 2 sets of clips. Posterior to this the cystic artery was identified and again dissected bluntly in a circumferential manner until a good window  was created. 2 clips were placed proximally  and one distally on the artery and the artery was divided between the 2 sets of clips. Next a laparoscopic hook cautery device was used to separate the gallbladder from the liver bed. Prior to completely detaching the gallbladder from the liver bed the liver bed was inspected and several small bleeding points were coagulated with the electrocautery until the area was completely hemostatic. The gallbladder was then detached the rest of it from the liver bed without difficulty. A laparoscopic bag was inserted through the hassan port. The laparoscope was moved to the epigastric port. The gallbladder was placed within the bag and the bag was sealed.  The bag with the gallbladder was then removed with the [facility] cannula through the infraumbilical port without difficulty. The fascial defect was then closed with the previously placed Vicryl pursestring stitch as well as with another figure-of-eight 0 Vicryl stitch. The liver bed was inspected again and found to be hemostatic. The abdomen was irrigated with copious amounts of saline until the effluent was clear. The ports were then removed under direct vision without difficulty and were found to be hemostatic. The gas was allowed to escape. The skin incisions were all closed with interrupted 4-0 Monocryl subcuticular stitches. Dermabond dressings were applied. The patient tolerated the procedure well. At the end of the case all needle sponge and instrument counts were correct. The patient was then awakened and taken to recovery in stable condition  PLAN OF CARE: Discharge to home after PACU  PATIENT DISPOSITION:  PACU - hemodynamically stable.   Delay start of Pharmacological VTE agent (>24hrs) due to surgical blood loss or risk of bleeding: not applicable

## 2017-10-01 NOTE — Anesthesia Postprocedure Evaluation (Signed)
Anesthesia Post Note  Patient: Karina Mitchell  Procedure(s) Performed: LAPAROSCOPIC CHOLECYSTECTOMY WITH INTRAOPERATIVE CHOLANGIOGRAM (N/A Abdomen)     Anesthesia Type: General    Last Vitals:  Vitals:   10/01/17 1000 10/01/17 1018  BP: 102/63 (!) 101/54  Pulse: 66   Resp: 15 16  Temp: 36.7 C 36.8 C  SpO2: 100% 100%    Last Pain:  Vitals:   10/01/17 1028  TempSrc:   PainSc: 2                  Panda Crossin

## 2017-10-02 ENCOUNTER — Encounter (HOSPITAL_COMMUNITY): Payer: Self-pay | Admitting: General Surgery

## 2018-03-22 DIAGNOSIS — K080 Exfoliation of teeth due to systemic causes: Secondary | ICD-10-CM | POA: Diagnosis not present

## 2018-06-23 DIAGNOSIS — Z3202 Encounter for pregnancy test, result negative: Secondary | ICD-10-CM | POA: Diagnosis not present

## 2018-06-23 DIAGNOSIS — Z01419 Encounter for gynecological examination (general) (routine) without abnormal findings: Secondary | ICD-10-CM | POA: Diagnosis not present

## 2018-06-23 DIAGNOSIS — Z01411 Encounter for gynecological examination (general) (routine) with abnormal findings: Secondary | ICD-10-CM | POA: Diagnosis not present

## 2018-06-23 DIAGNOSIS — Z1231 Encounter for screening mammogram for malignant neoplasm of breast: Secondary | ICD-10-CM | POA: Diagnosis not present

## 2018-06-23 DIAGNOSIS — Z6826 Body mass index (BMI) 26.0-26.9, adult: Secondary | ICD-10-CM | POA: Diagnosis not present

## 2018-06-23 DIAGNOSIS — N925 Other specified irregular menstruation: Secondary | ICD-10-CM | POA: Diagnosis not present

## 2018-08-31 DIAGNOSIS — D649 Anemia, unspecified: Secondary | ICD-10-CM | POA: Diagnosis not present

## 2018-08-31 DIAGNOSIS — Z0000 Encounter for general adult medical examination without abnormal findings: Secondary | ICD-10-CM | POA: Diagnosis not present

## 2018-08-31 DIAGNOSIS — E559 Vitamin D deficiency, unspecified: Secondary | ICD-10-CM | POA: Diagnosis not present

## 2018-08-31 DIAGNOSIS — Z23 Encounter for immunization: Secondary | ICD-10-CM | POA: Diagnosis not present

## 2018-08-31 DIAGNOSIS — Z136 Encounter for screening for cardiovascular disorders: Secondary | ICD-10-CM | POA: Diagnosis not present

## 2018-09-27 DIAGNOSIS — K080 Exfoliation of teeth due to systemic causes: Secondary | ICD-10-CM | POA: Diagnosis not present

## 2018-09-29 DIAGNOSIS — K111 Hypertrophy of salivary gland: Secondary | ICD-10-CM | POA: Diagnosis not present

## 2018-09-29 DIAGNOSIS — R0989 Other specified symptoms and signs involving the circulatory and respiratory systems: Secondary | ICD-10-CM | POA: Diagnosis not present

## 2018-10-01 ENCOUNTER — Other Ambulatory Visit: Payer: Self-pay | Admitting: Otolaryngology

## 2018-10-01 DIAGNOSIS — R198 Other specified symptoms and signs involving the digestive system and abdomen: Secondary | ICD-10-CM

## 2018-10-01 DIAGNOSIS — R0989 Other specified symptoms and signs involving the circulatory and respiratory systems: Secondary | ICD-10-CM

## 2018-10-01 DIAGNOSIS — K111 Hypertrophy of salivary gland: Secondary | ICD-10-CM

## 2018-10-06 ENCOUNTER — Ambulatory Visit
Admission: RE | Admit: 2018-10-06 | Discharge: 2018-10-06 | Disposition: A | Discharge status: Home / Self Care | Payer: Federal, State, Local not specified - PPO | Source: Ambulatory Visit | Attending: Otolaryngology | Admitting: Otolaryngology

## 2018-10-06 ENCOUNTER — Other Ambulatory Visit: Payer: Self-pay | Admitting: Otolaryngology

## 2018-10-06 DIAGNOSIS — R198 Other specified symptoms and signs involving the digestive system and abdomen: Secondary | ICD-10-CM

## 2018-10-06 DIAGNOSIS — R0989 Other specified symptoms and signs involving the circulatory and respiratory systems: Secondary | ICD-10-CM

## 2018-10-06 DIAGNOSIS — K111 Hypertrophy of salivary gland: Secondary | ICD-10-CM

## 2018-10-06 MED ORDER — IOPAMIDOL (ISOVUE-300) INJECTION 61%
75.0000 mL | Freq: Once | INTRAVENOUS | Status: AC | PRN
  Administered 2018-10-06: 75 mL via INTRAVENOUS

## 2018-10-07 ENCOUNTER — Telehealth: Payer: Self-pay | Admitting: Nurse Practitioner

## 2018-10-07 NOTE — Telephone Encounter (Incomplete)
Phone call to patient to review instructions for 13hr prep for CT scan with contrast on 10/18 at 11:00am.

## 2018-10-08 ENCOUNTER — Ambulatory Visit
Admission: RE | Admit: 2018-10-08 | Discharge: 2018-10-08 | Disposition: A | Discharge status: Home / Self Care | Payer: Federal, State, Local not specified - PPO | Source: Ambulatory Visit | Attending: Otolaryngology | Admitting: Otolaryngology

## 2018-10-08 DIAGNOSIS — K111 Hypertrophy of salivary gland: Secondary | ICD-10-CM | POA: Diagnosis not present

## 2018-10-08 DIAGNOSIS — R198 Other specified symptoms and signs involving the digestive system and abdomen: Secondary | ICD-10-CM

## 2018-10-08 DIAGNOSIS — R0989 Other specified symptoms and signs involving the circulatory and respiratory systems: Secondary | ICD-10-CM

## 2018-10-08 MED ORDER — IOPAMIDOL (ISOVUE-300) INJECTION 61%
75.0000 mL | Freq: Once | INTRAVENOUS | Status: AC | PRN
  Administered 2018-10-08: 75 mL via INTRAVENOUS

## 2018-10-28 IMAGING — US US ABDOMEN COMPLETE
1 series · 14 of 25 positions shown · non-contrast
Comparison: None.

CLINICAL DATA: Nausea

EXAM:
ABDOMEN ULTRASOUND COMPLETE

[Series 1: us abdomen complete · 0.15mm/px · 14 of 86 slices shown]
[im 1/86]
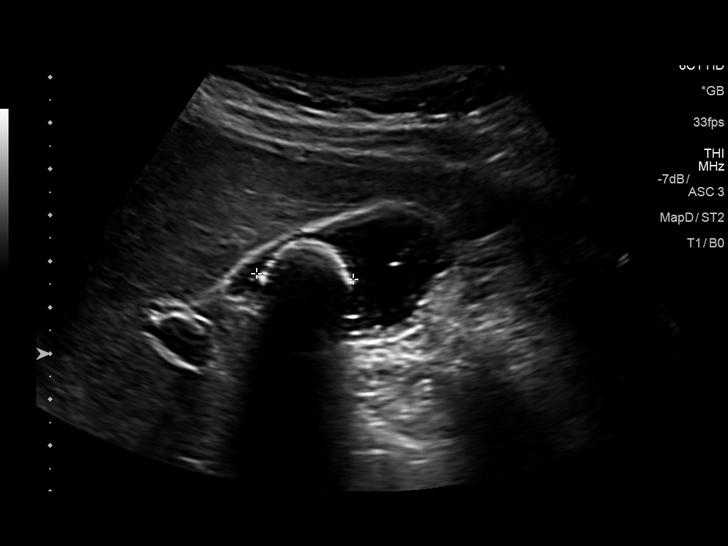
[im 8/86]
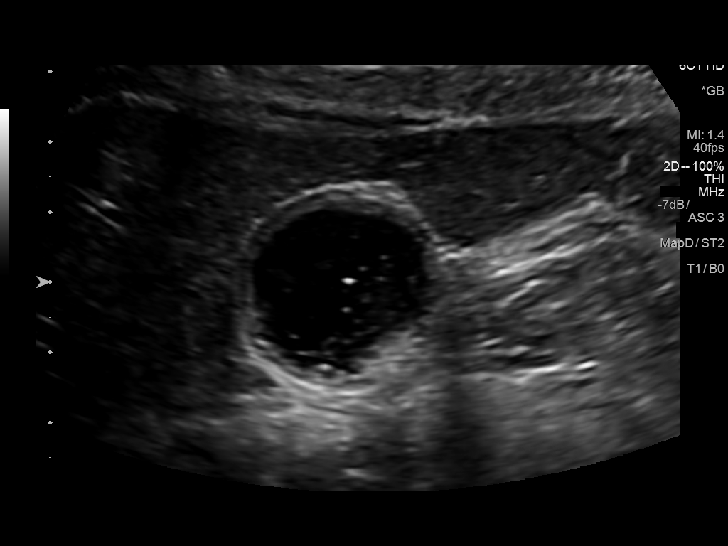
[im 15/86]
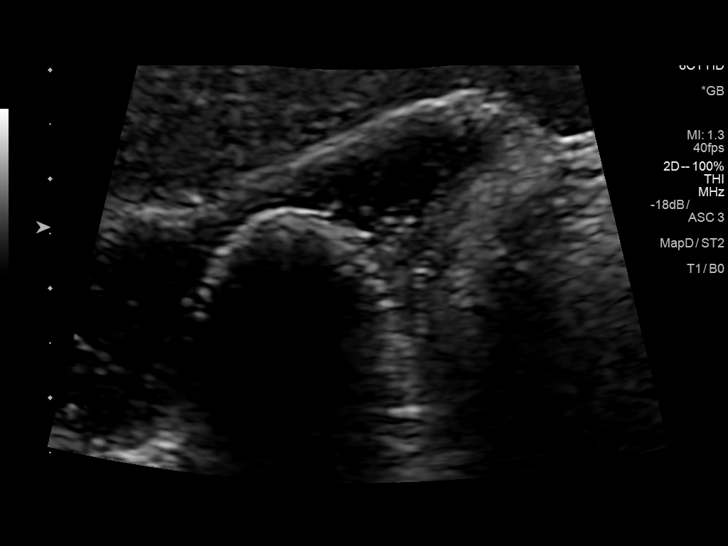
[im 22/86]
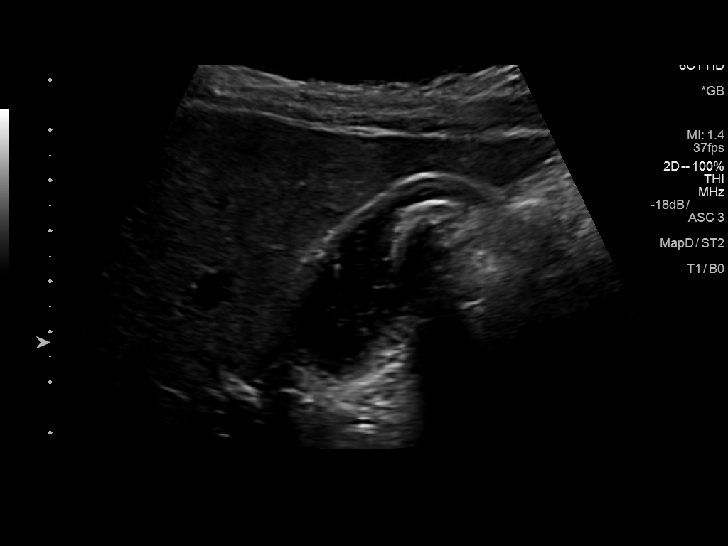
[im 29/86]
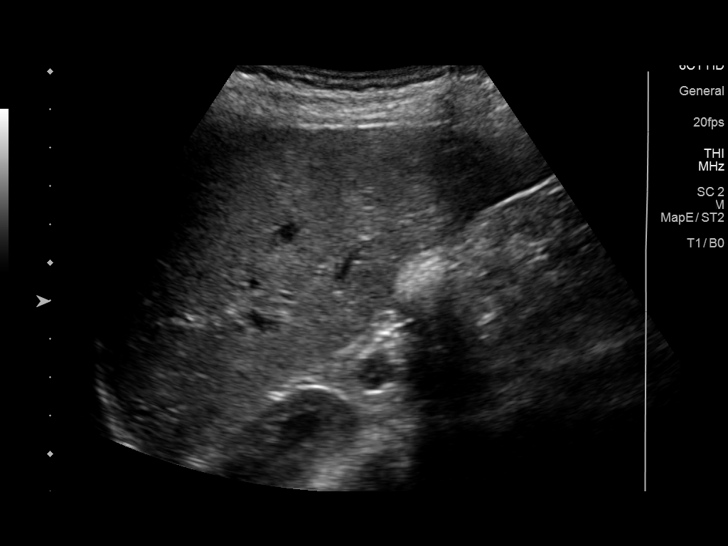
[im 32/86]
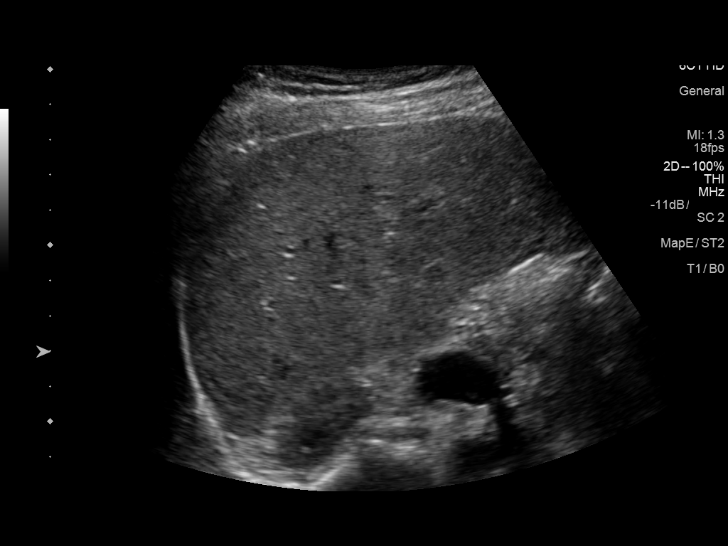
[im 39/86]
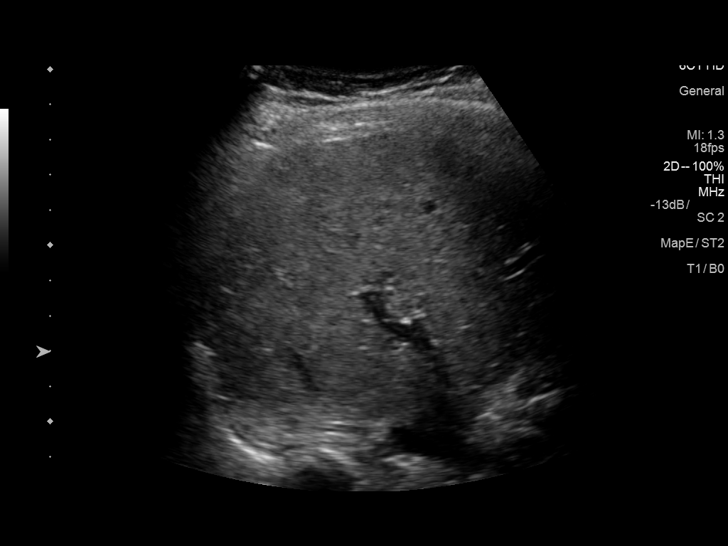
[im 47/86]
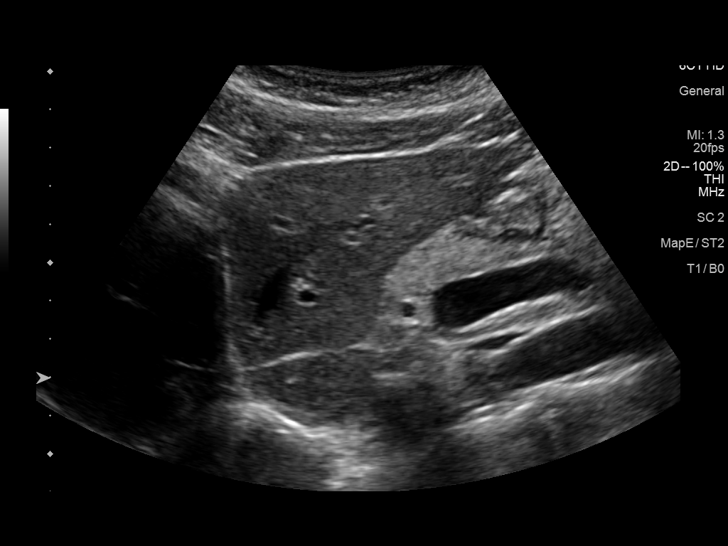
[im 54/86]
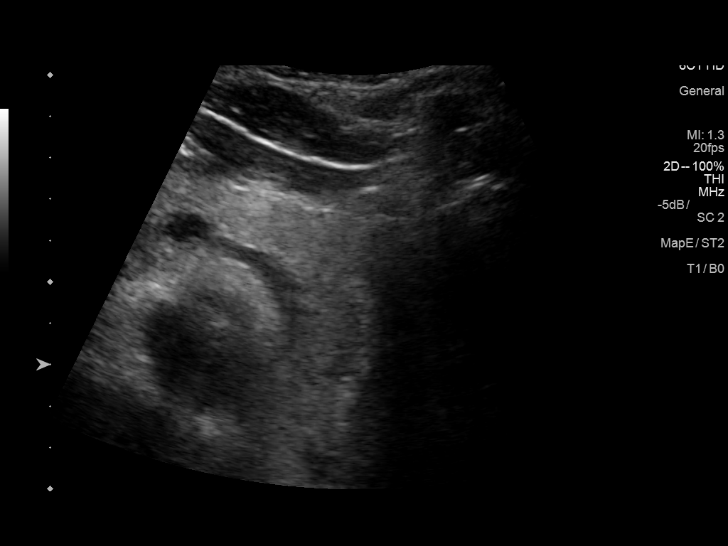
[im 57/86]
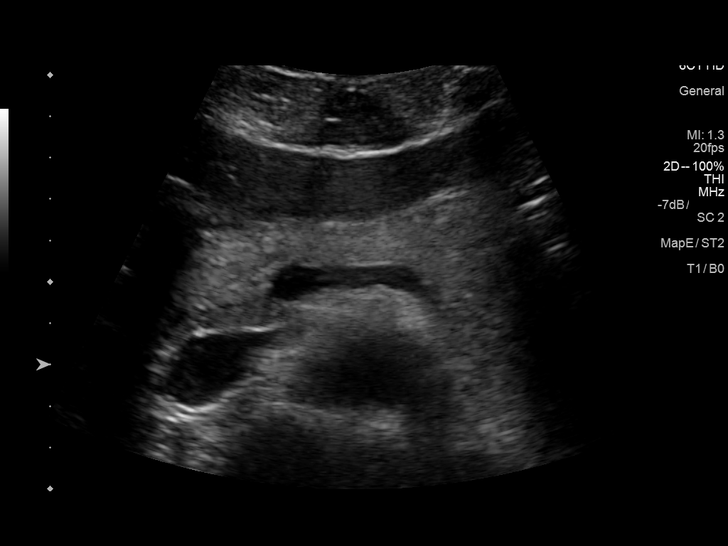
[im 64/86]
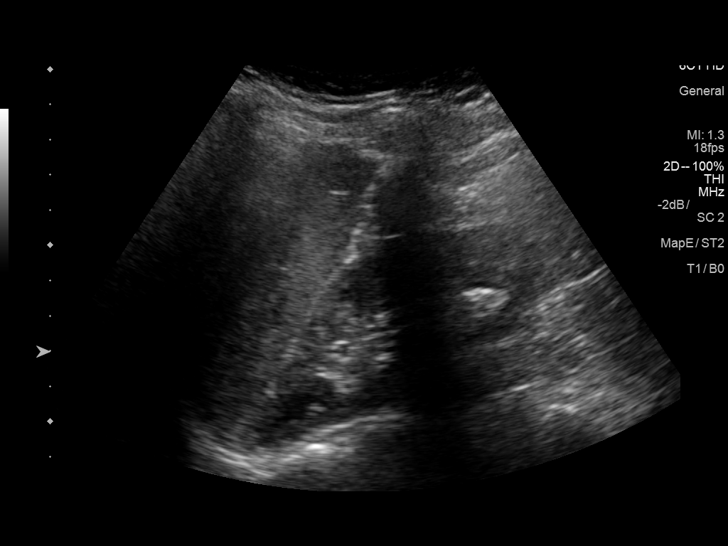
[im 71/86]
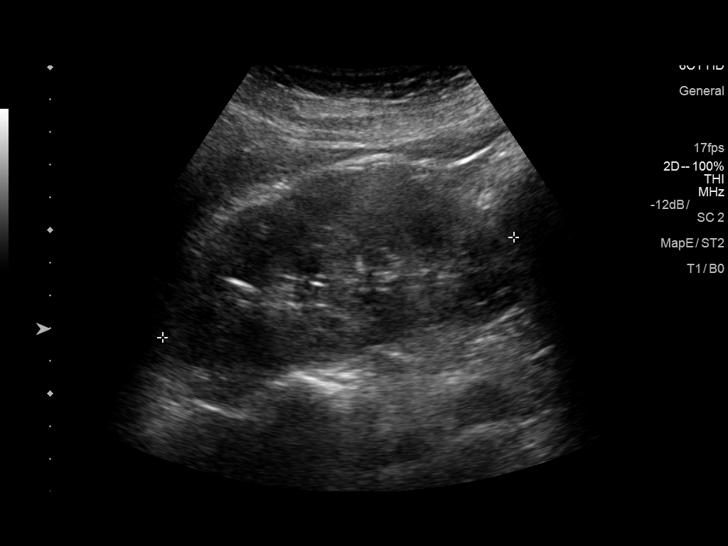
[im 78/86]
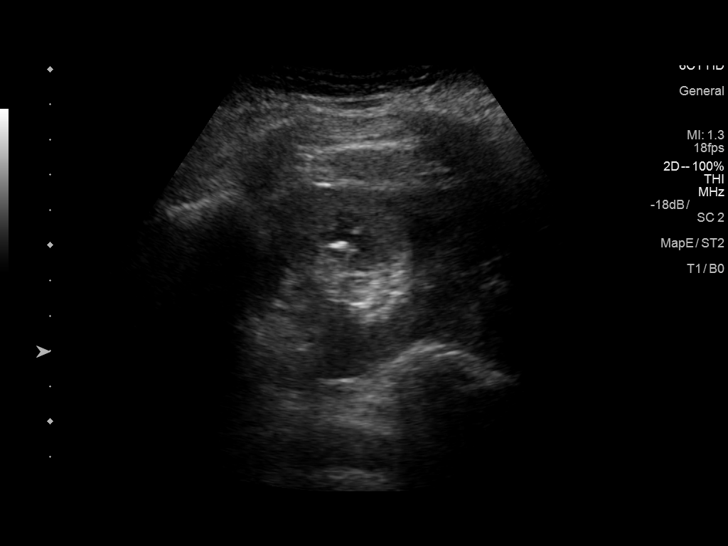
[im 86/86]
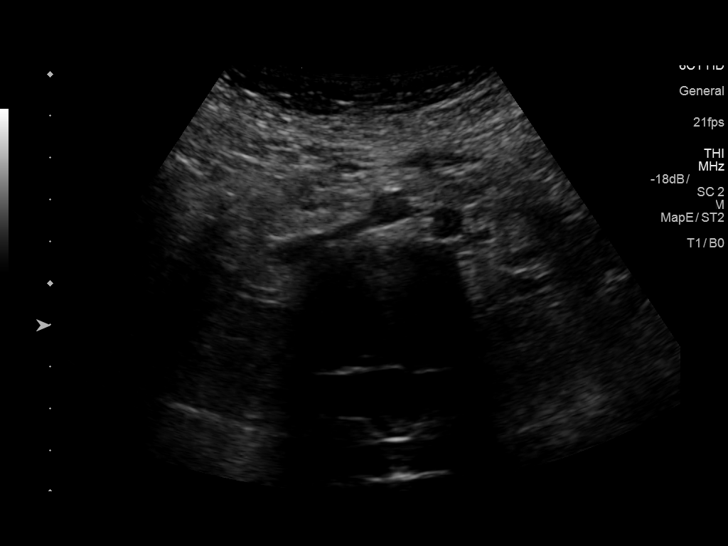

[14 of 25 positions shown; findings below may reference images not displayed]

FINDINGS: Gallbladder: Well distended with cholelithiasis an multiple
cholesterol crystals.

Common bile duct: Diameter: 5 mm

Liver: No focal lesion identified. Within normal limits in
parenchymal echogenicity. Normal hepatopetal flow is noted within
the portal vein.

IVC: No abnormality visualized.

Pancreas: Visualized portion unremarkable.

Spleen: Size and appearance within normal limits.

Right Kidney: Length: 10.8 cm. Echogenicity within normal limits. No
mass or hydronephrosis visualized.

Left Kidney: Length: 11.2 cm. Echogenicity within normal limits. No
mass or hydronephrosis visualized.

Abdominal aorta: No aneurysm visualized.

Other findings: None.
IMPRESSION: Cholelithiasis and cholesterol crystals.

No other focal abnormality is noted.

## 2018-11-14 IMAGING — RF DG CHOLANGIOGRAM OPERATIVE
1 series · 12 of 12 positions shown · non-contrast
Comparison: Abdominal ultrasound - 08/08/2027

CLINICAL DATA: Intraoperative cholangiogram during laparoscopic
cholecystectomy.

EXAM:
INTRAOPERATIVE CHOLANGIOGRAM
FLUOROSCOPY TIME:  42 seconds (2.87 2.87 mGy)

[Series 1: run · 3 acquisitions, 12 frames shown]
[im 1/3]
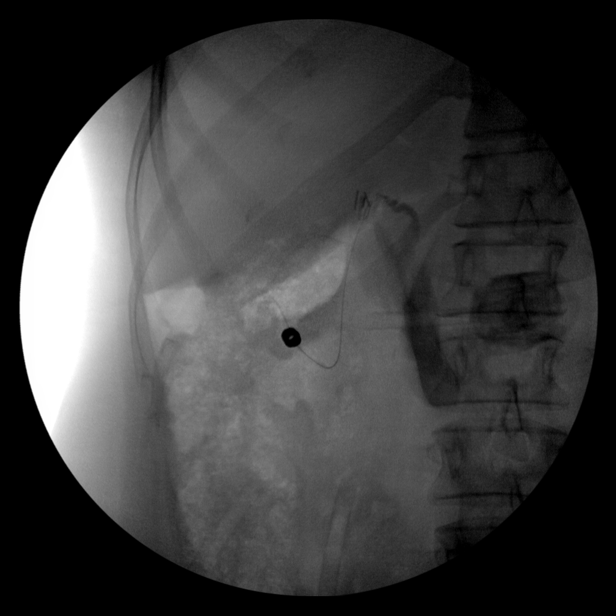
[im 1/3]
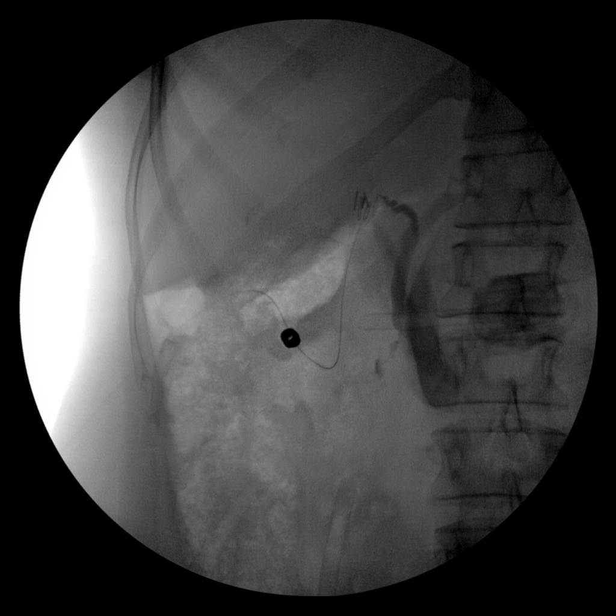
[im 1/3]
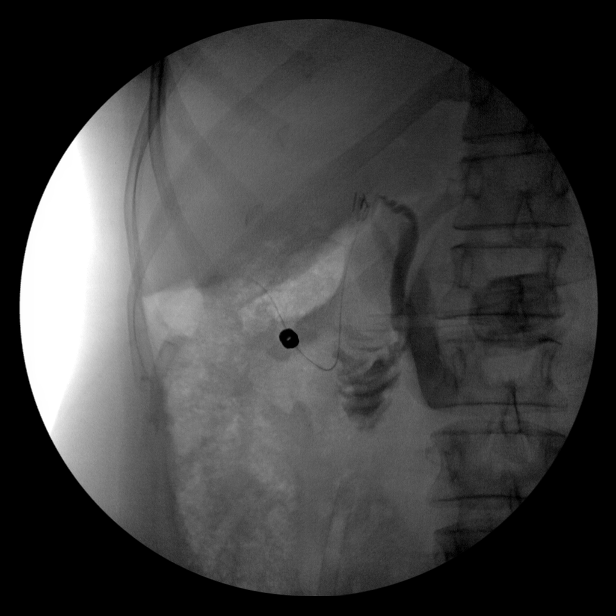
[im 1/3]
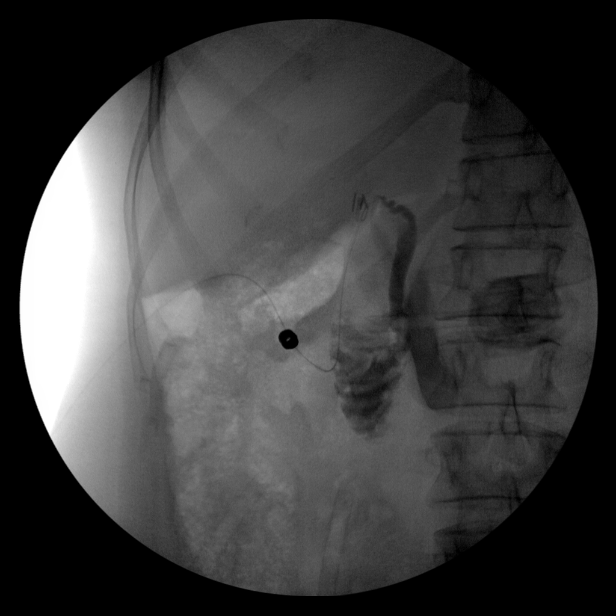
[im 2/3]
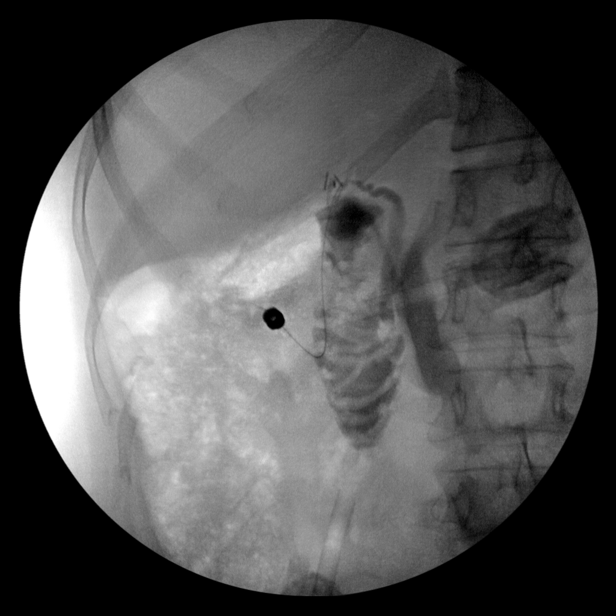
[im 2/3]
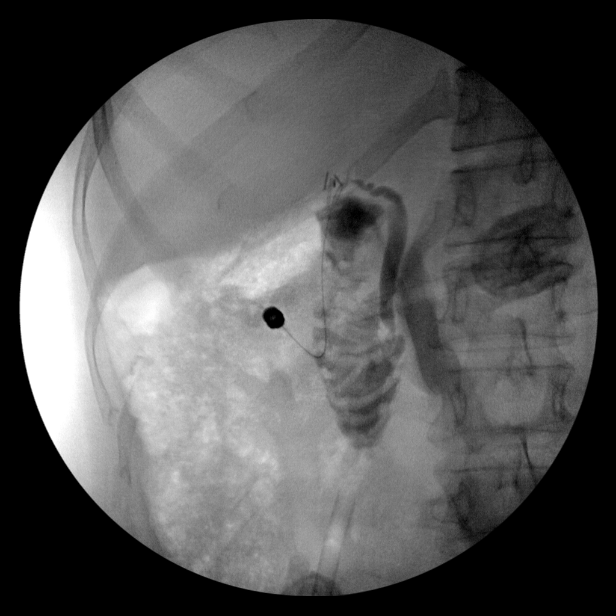
[im 2/3]
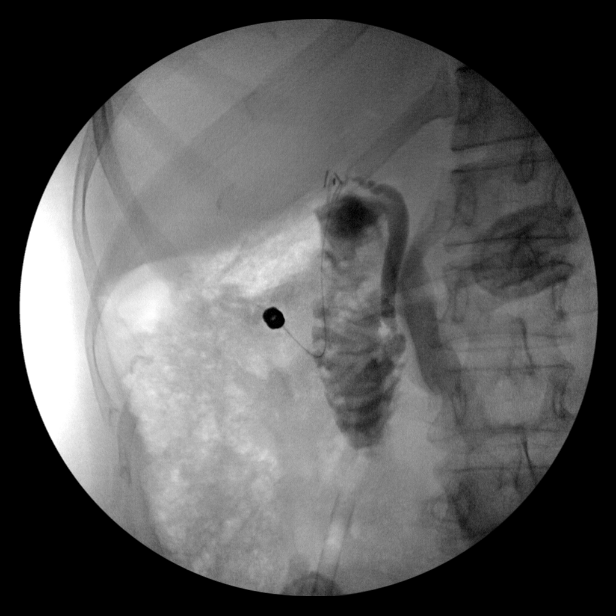
[im 2/3]
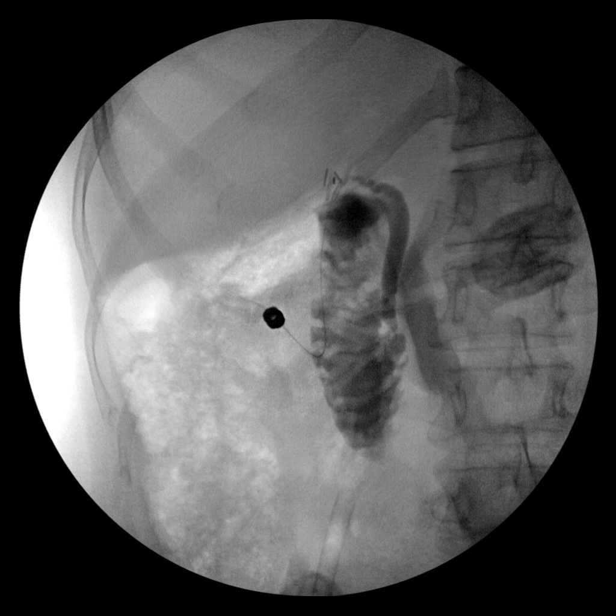
[im 3/3]
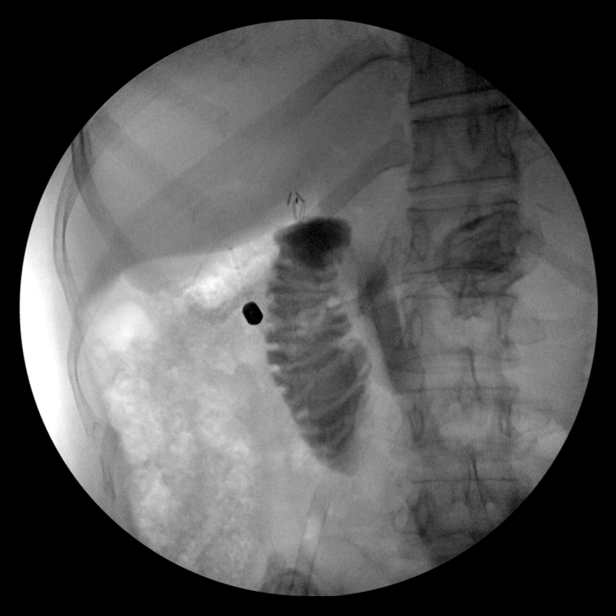
[im 3/3]
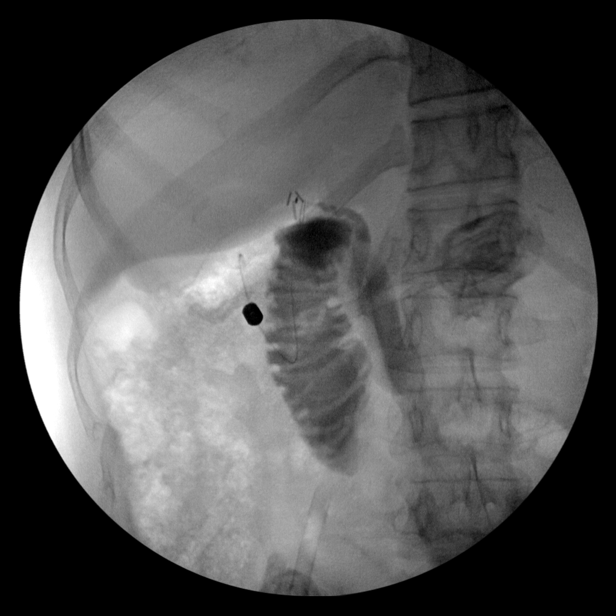
[im 3/3]
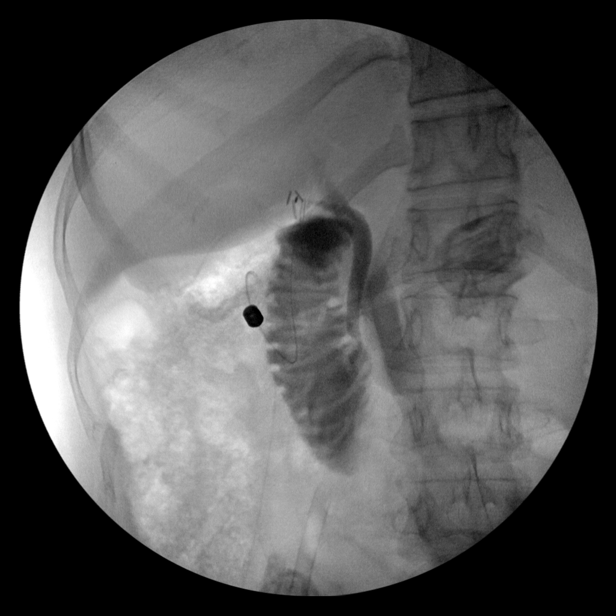
[im 3/3]
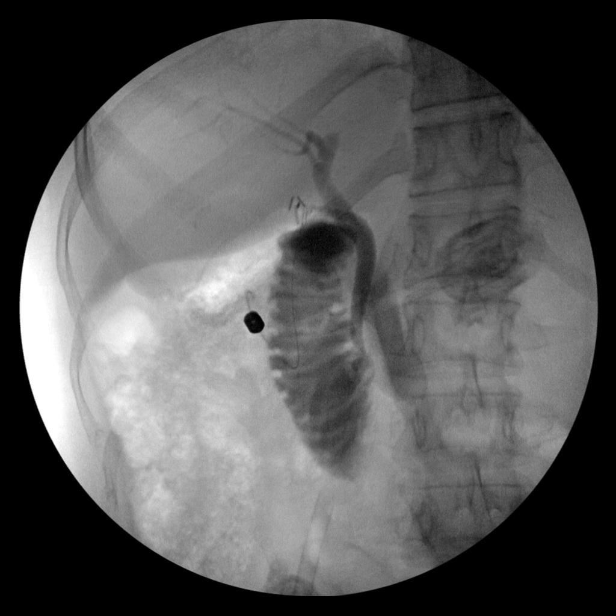

[12 of 12 positions shown; findings below may reference images not displayed]

FINDINGS: Intraoperative cholangiographic images of the right upper abdominal
quadrant during laparoscopic cholecystectomy are provided for
review.

Surgical clips overlie the expected location of the gallbladder
fossa.

Contrast injection demonstrates selective cannulation of the central
aspect of the cystic duct.

There is passage of contrast through the central aspect of the
cystic duct with filling of a non dilated common bile duct. There is
passage of contrast though the CBD and into the descending portion
of the duodenum.

There is minimal reflux of injected contrast into the common hepatic
duct and central aspect of the non dilated intrahepatic biliary
system. There is minimal opacification the central aspect the
pancreatic duct which appears nondilated.

There are no discrete filling defects within the opacified portions
of the biliary system to suggest the presence of
choledocholithiasis.
IMPRESSION: No evidence of choledocholithiasis.

## 2019-04-14 DIAGNOSIS — M25562 Pain in left knee: Secondary | ICD-10-CM | POA: Diagnosis not present

## 2019-04-14 DIAGNOSIS — M25462 Effusion, left knee: Secondary | ICD-10-CM | POA: Diagnosis not present

## 2019-10-31 DIAGNOSIS — Z20828 Contact with and (suspected) exposure to other viral communicable diseases: Secondary | ICD-10-CM | POA: Diagnosis not present

## 2019-10-31 DIAGNOSIS — Z0000 Encounter for general adult medical examination without abnormal findings: Secondary | ICD-10-CM | POA: Diagnosis not present

## 2019-11-30 DIAGNOSIS — Z0000 Encounter for general adult medical examination without abnormal findings: Secondary | ICD-10-CM | POA: Diagnosis not present

## 2019-11-30 DIAGNOSIS — Z1231 Encounter for screening mammogram for malignant neoplasm of breast: Secondary | ICD-10-CM | POA: Diagnosis not present

## 2019-11-30 DIAGNOSIS — D649 Anemia, unspecified: Secondary | ICD-10-CM | POA: Diagnosis not present

## 2019-11-30 DIAGNOSIS — E559 Vitamin D deficiency, unspecified: Secondary | ICD-10-CM | POA: Diagnosis not present

## 2019-11-30 DIAGNOSIS — Z13220 Encounter for screening for lipoid disorders: Secondary | ICD-10-CM | POA: Diagnosis not present

## 2019-11-30 DIAGNOSIS — Z1151 Encounter for screening for human papillomavirus (HPV): Secondary | ICD-10-CM | POA: Diagnosis not present

## 2019-11-30 DIAGNOSIS — Z6826 Body mass index (BMI) 26.0-26.9, adult: Secondary | ICD-10-CM | POA: Diagnosis not present

## 2019-11-30 DIAGNOSIS — Z01419 Encounter for gynecological examination (general) (routine) without abnormal findings: Secondary | ICD-10-CM | POA: Diagnosis not present

## 2019-12-08 DIAGNOSIS — D649 Anemia, unspecified: Secondary | ICD-10-CM | POA: Diagnosis not present

## 2019-12-29 DIAGNOSIS — R002 Palpitations: Secondary | ICD-10-CM | POA: Diagnosis not present

## 2019-12-29 DIAGNOSIS — R5383 Other fatigue: Secondary | ICD-10-CM | POA: Diagnosis not present

## 2020-01-02 DIAGNOSIS — R002 Palpitations: Secondary | ICD-10-CM | POA: Diagnosis not present

## 2020-01-02 NOTE — Progress Notes (Addendum)
Cardiology Office Note:    Date:  01/03/2020   ID:  Karina, Mitchell August 29, 1976, MRN [id number]  PCP:  Lois Huxley, PA  Cardiologist:  Shirlee More, MD   Referring MD: Lois Huxley, PA  ASSESSMENT:    1. Palpitations   2. Iron deficiency    PLAN:    In order of problems listed above:  1. Symptoms are suggestive of atrial tachyarrhythmia we will plan ZIO monitor today for 1 week make a decision about medical treatment at that time.  If she has complex arrhythmia like atrial fibrillation we will next consider an echocardiogram.  Otherwise I do not think she needs imaging or an ischemia evaluation.  Already had labs performed including a CBC and thyroid studies. 2. Resume iron supplements  Next appointment 4 weeks  Medication Adjustments/Labs and Tests Ordered: Current medicines are reviewed at length with the patient today.  Concerns regarding medicines are outlined above.  No orders of the defined types were placed in this encounter.  No orders of the defined types were placed in this encounter.    Chief Complaint  Patient presents with  . Palpitations    History of Present Illness:    Karina Mitchell is a 44 y.o. female who is being seen today for the evaluation of palpitation at the request of Lois Huxley, [place]. Last week she has been aware of her heart beating its been bothersome happens repetitively throughout the day and describes it as rapid and forceful.  Associated with lightheadedness but no syncope.  She takes no over-the-counter proarrhythmic drugs and only new medication is iron supplement that she stopped.  No known history of heart disease and has had no shortness of breath or chest pain.  Symptoms suggest COVID-19 no fever chills cough or sputum production.  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  Past Medical History:  Diagnosis Date  . Clotting disorder ([place])     related to pregnancy/childbirth ;  only was on lovenox and ASA temporarily to reduce risk of clot formation   . Dyslipidemia   . Endometriosis   . 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 Vitamin D deficiency     Past Surgical History:  Procedure Laterality Date  . CESAREAN SECTION     x2   . CHOLECYSTECTOMY N/A 10/01/2017   Procedure: LAPAROSCOPIC CHOLECYSTECTOMY WITH INTRAOPERATIVE CHOLANGIOGRAM;  Surgeon: Jovita Kussmaul, MD;  Location: [facility];  Service: General;  Laterality: N/A;  . LAPAROSCOPIC ENDOMETRIOSIS FULGURATION  2007  . TUBAL LIGATION      Current Medications: Current Meds  Medication Sig  . fexofenadine (ALLEGRA) 180 MG tablet Take 180 mg by mouth daily as needed for allergies or rhinitis.  . Multiple Vitamin (MULTIVITAMIN WITH MINERALS) TABS tablet Take 1 tablet by mouth daily.  . naproxen (NAPROSYN) 500 MG tablet Take 500 mg by mouth 2 (two) times daily as needed for mild pain.   Bethann Humble Sulfate (EYE DROPS ALLERGY RELIEF OP) Apply 1 drop to eye daily.     Allergies:   Contrast media [iodinated diagnostic agents] and Shellfish allergy   Social History   Socioeconomic History  . Marital status: Married    Spouse name: Not on file  . Number of children: Not on file  . Years of education: Not on file  . Highest education level: Not on file  Occupational History  .  Not on file  Tobacco Use  . Smoking status: Never Smoker  . Smokeless tobacco: Never Used  Substance and Sexual Activity  . Alcohol use: No  . Drug use: No  . Sexual activity: Yes    Birth control/protection: Surgical  Other Topics Concern  . Not on file  Social History Narrative  . Not on file   Social Determinants of Health   Financial Resource Strain:   . Difficulty of Paying Living Expenses: Not on file  Food Insecurity:   . Worried About Charity fundraiser in the Last Year: Not on file  . Ran Out of Food in the Last Year: Not on file  Transportation  Needs:   . Lack of Transportation (Medical): Not on file  . Lack of Transportation (Non-Medical): Not on file  Physical Activity:   . Days of Exercise per Week: Not on file  . Minutes of Exercise per Session: Not on file  Stress:   . Feeling of Stress : Not on file  Social Connections:   . Frequency of Communication with Friends and Family: Not on file  . Frequency of Social Gatherings with Friends and Family: Not on file  . Attends Religious Services: Not on file  . Active Member of Clubs or Organizations: Not on file  . Attends Archivist Meetings: Not on file  . Marital Status: Not on file     Family History: The patient's family history includes Hypertension in her father and mother.  ROS:   Review of Systems  Constitution: Negative.  HENT: Negative.   Eyes: Negative.   Cardiovascular: Positive for palpitations.  Respiratory: Negative.   Endocrine: Negative.   Hematologic/Lymphatic: Negative.   Skin: Negative.   Musculoskeletal: Negative.   Gastrointestinal: Negative.   Genitourinary: Negative.   Neurological: Positive for dizziness.  Psychiatric/Behavioral: Negative.   Allergic/Immunologic: Negative.    Please see the history of present illness.     All other systems reviewed and are negative.  EKGs/Labs/Other Studies Reviewed:    The following studies were reviewed today:  We received the EKG from her primary care physician performed 12/29/2019 independently reviewed shows sinus rhythm normal EKG including QT interval QRST morphology.  EKG:  EKG an EKG done 12/28/2018 at her PCP she requested we not repeated of aspirin to be sent to the office I will put an addendum to the chart it was described as normal Patient labs 11/30/2019 include a normal TSH creatinine potassium 4.1 and hemoglobin.  Her cholesterol is 168 HDL 46 LDL 108  Physical Exam:    VS:  BP 112/70 (BP Location: Left Arm, Patient Position: Sitting, Cuff Size: Normal)   Pulse 71   Ht 5'  1" (1.549 m)   Wt 141 lb (64 kg)   SpO2 99%   BMI 26.64 kg/m     Wt Readings from Last 3 Encounters:  01/03/20 141 lb (64 kg)  10/01/17 140 lb (63.5 kg)  09/28/17 140 lb (63.5 kg)     GEN: No pallor of skin or membranes and no tremor well nourished, well developed in no acute distress HEENT: Normal NECK: No JVD; No carotid bruits LYMPHATICS: No lymphadenopathy CARDIAC: Irregularity RRR, no murmurs, rubs, gallops RESPIRATORY:  Clear to auscultation without rales, wheezing or rhonchi  ABDOMEN: Soft, non-tender, non-distended MUSCULOSKELETAL:  No edema; No deformity  SKIN: Warm and dry NEUROLOGIC:  Alert and oriented x 3 PSYCHIATRIC:  Normal affect     Signed, Shirlee More, MD  01/03/2020 3:23  PM    [facility]

## 2020-01-03 ENCOUNTER — Ambulatory Visit (INDEPENDENT_AMBULATORY_CARE_PROVIDER_SITE_OTHER): Payer: Federal, State, Local not specified - PPO | Admitting: Cardiology

## 2020-01-03 ENCOUNTER — Other Ambulatory Visit: Payer: Self-pay

## 2020-01-03 VITALS — BP 112/70 | HR 71 | Ht 61.0 in | Wt 141.0 lb

## 2020-01-03 DIAGNOSIS — R002 Palpitations: Secondary | ICD-10-CM

## 2020-01-03 DIAGNOSIS — E611 Iron deficiency: Secondary | ICD-10-CM | POA: Diagnosis not present

## 2020-01-03 NOTE — Patient Instructions (Signed)
Medication Instructions:  None  *If you need a refill on your cardiac medications before your next appointment, please call your pharmacy*  Lab Work: None  If you have labs (blood work) drawn today and your tests are completely normal, you will receive your results only by: Marland Kitchen MyChart Message (if you have MyChart) OR . A paper copy in the mail If you have any lab test that is abnormal or we need to change your treatment, we will call you to review the results.  Testing/Procedures: A zio monitor was ordered today. It will remain on for 14 days. You will then return monitor and event diary in provided box. It takes 1-2 weeks for report to be downloaded and returned to [place]. We will call you with the results. If monitor falls off or has orange flashing light, please call Zio for further instructions.     Follow-Up: At [facility], you and your health needs are our priority.  As part of our continuing mission to provide you with exceptional heart care, we have created designated Provider Care Teams.  These Care Teams include your primary Cardiologist (physician) and Advanced Practice Providers (APPs -  Physician Assistants and Nurse Practitioners) who all work together to provide you with the care you need, when you need it.  Your next appointment:   4 week(s)  The format for your next appointment:   In Person  Provider:   Shirlee More, MD  Other Instructions None

## 2020-01-06 ENCOUNTER — Ambulatory Visit (INDEPENDENT_AMBULATORY_CARE_PROVIDER_SITE_OTHER): Payer: Federal, State, Local not specified - PPO

## 2020-01-06 DIAGNOSIS — R002 Palpitations: Secondary | ICD-10-CM | POA: Diagnosis not present

## 2020-01-06 DIAGNOSIS — E611 Iron deficiency: Secondary | ICD-10-CM | POA: Diagnosis not present

## 2020-01-20 ENCOUNTER — Telehealth: Payer: Self-pay

## 2020-01-20 NOTE — Telephone Encounter (Signed)
The patient has been notified of the monitor result and verbalized understanding.  All questions (if any) were answered. Frederik Schmidt, RN 01/20/2020 8:46 AM

## 2020-01-20 NOTE — Telephone Encounter (Signed)
-----   Message from Richardo Priest, MD sent at 01/19/2020  4:43 PM EST ----- Normal or stable result  This is a very good report there are very few extra beats most of the episodes of symptoms and triggered are not associated with abnormal heartbeat.  At this time I would not place her on a cardiac medications

## 2020-01-20 NOTE — Telephone Encounter (Signed)
lpmtcb 1/29 

## 2020-01-30 NOTE — Progress Notes (Signed)
Cardiology Office Note:    Date:  01/31/2020   ID:  Karina Mitchell, Karina Mitchell 1976/02/10, MRN [id number]  PCP:  Lois Huxley, PA  Cardiologist:  Shirlee More, MD    Referring MD: Lois Huxley, PA    ASSESSMENT:    1. Palpitations    PLAN:    In order of problems listed above:  1. She is improved clinically it appears her over-the-counter collagen supplement was provocative I have asked her avoid over-the-counter proarrhythmic drugs at this time I do not think she requires beta-blocker antiarrhythmic drug and she is reassured.  Has questions about exercise intolerance beneficial discussed heart rate stones. 2. She is no longer taking iron supplement   Next appointment: As needed should communicate through my chart if she is having problems   Medication Adjustments/Labs and Tests Ordered: Current medicines are reviewed at length with the patient today.  Concerns regarding medicines are outlined above.  No orders of the defined types were placed in this encounter.  No orders of the defined types were placed in this encounter.   No chief complaint on file.   History of Present Illness:    Karina Mitchell is a 44 y.o. female with a hx of palpitattion last seen 01/03/2020. Compliance with diet, lifestyle and medications:   Reviewed her ZIO report she is reassured and is not having palpitation.  She thinks in retrospect it was over-the-counter collagen supplement that was provocative but I cannot draw straight line between the events and palpitation I think it is best to avoid it and considered idiosyncratic reaction.  Her ZIO monitor showed no noteworthy arrhythmia and her triggered and symptomatic events are generally unassociated with arrhythmia.  Zio report: Study Highlights  A ZIO monitor was performed for 5 days beginning 01/06/2020 to assess palpitation.  The heart rhythm throughout was sinus.  There were no pauses of 3 seconds or greater no episodes of AV nodal or  sinus node exit block.  Rare PVCs were present with one 4 beat run of ventricular premature contractions.  Supraventricular arrhythmia was rare with no episodes of atrial fibrillation or flutter.  There were 8 diary events, 7 are unassociated with arrhythmia 1 was associated with a 3 beat run of atrial premature contractions.  There were 36 triggered events 2 of them associated with isolated single APCs.  Other events sinus rhythm   Conclusion, rare ventricular and supraventricular arrhythmia.  No ventricular arrhythmias seen was one 4 beat run of PVCs.  Both diary and triggered events were predominantly unassociated with arrhythmia..    Past Medical History:  Diagnosis Date  . Clotting disorder ([place])    related to pregnancy/childbirth ;  only was on lovenox and ASA temporarily to reduce risk of clot formation   . Dyslipidemia   . Endometriosis   . 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 Vitamin D deficiency     Past Surgical History:  Procedure Laterality Date  . CESAREAN SECTION     x2   . CHOLECYSTECTOMY N/A 10/01/2017   Procedure: LAPAROSCOPIC CHOLECYSTECTOMY WITH INTRAOPERATIVE CHOLANGIOGRAM;  Surgeon: Jovita Kussmaul, MD;  Location: [facility];  Service: General;  Laterality: N/A;  . LAPAROSCOPIC ENDOMETRIOSIS FULGURATION  2007  . TUBAL LIGATION      Current Medications: Current Meds  Medication Sig  . fexofenadine (ALLEGRA) 180 MG tablet Take 180 mg by mouth daily as needed for allergies or rhinitis.  . Multiple Vitamin (MULTIVITAMIN WITH MINERALS) TABS tablet Take 1  tablet by mouth daily.  . naproxen (NAPROSYN) 500 MG tablet Take 500 mg by mouth 2 (two) times daily as needed for mild pain.   Bethann Humble Sulfate (EYE DROPS ALLERGY RELIEF OP) Apply 1 drop to eye daily.     Allergies:   Contrast media [iodinated diagnostic agents] and Shellfish allergy   Social History   Socioeconomic History  . Marital status: Married    Spouse name:  Not on file  . Number of children: Not on file  . Years of education: Not on file  . Highest education level: Not on file  Occupational History  . Not on file  Tobacco Use  . Smoking status: Never Smoker  . Smokeless tobacco: Never Used  Substance and Sexual Activity  . Alcohol use: No  . Drug use: No  . Sexual activity: Yes    Birth control/protection: Surgical  Other Topics Concern  . Not on file  Social History Narrative  . Not on file   Social Determinants of Health   Financial Resource Strain:   . Difficulty of Paying Living Expenses: Not on file  Food Insecurity:   . Worried About Charity fundraiser in the Last Year: Not on file  . Ran Out of Food in the Last Year: Not on file  Transportation Needs:   . Lack of Transportation (Medical): Not on file  . Lack of Transportation (Non-Medical): Not on file  Physical Activity:   . Days of Exercise per Week: Not on file  . Minutes of Exercise per Session: Not on file  Stress:   . Feeling of Stress : Not on file  Social Connections:   . Frequency of Communication with Friends and Family: Not on file  . Frequency of Social Gatherings with Friends and Family: Not on file  . Attends Religious Services: Not on file  . Active Member of Clubs or Organizations: Not on file  . Attends Archivist Meetings: Not on file  . Marital Status: Not on file     Family History: The patient's family history includes Hypertension in her father and mother. ROS:   Please see the history of present illness.    All other systems reviewed and are negative.  EKGs/Labs/Other Studies Reviewed:    The following studies were reviewed today:   Recent Labs: No results found for requested labs within last 8760 hours.  Recent Lipid Panel No results found for: CHOL, TRIG, HDL, CHOLHDL, VLDL, LDLCALC, LDLDIRECT  Physical Exam:    VS:  BP 98/70   Pulse 62   Temp 98.2 F (36.8 C)   Ht 5\' 1"  (1.549 m)   Wt 142 lb (64.4 kg)   SpO2  99%   BMI 26.83 kg/m     Wt Readings from Last 3 Encounters:  01/31/20 142 lb (64.4 kg)  01/03/20 141 lb (64 kg)  10/01/17 140 lb (63.5 kg)     GEN:  Well nourished, well developed in no acute distress HEENT: Normal NECK: No JVD; No carotid bruits LYMPHATICS: No lymphadenopathy CARDIAC: RRR, no murmurs, rubs, gallops RESPIRATORY:  Clear to auscultation without rales, wheezing or rhonchi  ABDOMEN: Soft, non-tender, non-distended MUSCULOSKELETAL:  No edema; No deformity  SKIN: Warm and dry NEUROLOGIC:  Alert and oriented x 3 PSYCHIATRIC:  Normal affect    Signed, Shirlee More, MD  01/31/2020 9:29 AM    [place]

## 2020-01-31 ENCOUNTER — Encounter: Payer: Self-pay | Admitting: Cardiology

## 2020-01-31 ENCOUNTER — Other Ambulatory Visit: Payer: Self-pay

## 2020-01-31 ENCOUNTER — Ambulatory Visit (INDEPENDENT_AMBULATORY_CARE_PROVIDER_SITE_OTHER): Payer: Federal, State, Local not specified - PPO | Admitting: Cardiology

## 2020-01-31 VITALS — BP 98/70 | HR 62 | Temp 98.2°F | Ht 61.0 in | Wt 142.0 lb

## 2020-01-31 DIAGNOSIS — E611 Iron deficiency: Secondary | ICD-10-CM | POA: Diagnosis not present

## 2020-01-31 DIAGNOSIS — R002 Palpitations: Secondary | ICD-10-CM | POA: Diagnosis not present

## 2020-01-31 NOTE — Addendum Note (Signed)
Addended by: Linton Ham on: 01/31/2020 09:42 AM   Modules accepted: Orders

## 2020-01-31 NOTE — Patient Instructions (Addendum)
Medication Instructions:  Your physician recommends that you continue on your current medications as directed. Please refer to the Current Medication list given to you today.  *If you need a refill on your cardiac medications before your next appointment, please call your pharmacy*  Lab Work: None ordered   If you have labs (blood work) drawn today and your tests are completely normal, you will receive your results only by: Marland Kitchen MyChart Message (if you have MyChart) OR . A paper copy in the mail If you have any lab test that is abnormal or we need to change your treatment, we will call you to review the results.  Testing/Procedures: None ordered   Follow-Up: Follow up with our office as needed   Other Instructions None  1. Avoid all over-the-counter antihistamines except Claritin/Loratadine and Zyrtec/Cetrizine. 2. Avoid all combination including cold sinus allergies flu decongestant and sleep medications 3. You can use Robitussin DM Mucinex and Mucinex DM for cough. 4. can use Tylenol aspirin ibuprofen and naproxen but no combinations such as sleep or sinus.

## 2020-02-17 DIAGNOSIS — D500 Iron deficiency anemia secondary to blood loss (chronic): Secondary | ICD-10-CM | POA: Diagnosis not present

## 2020-02-17 DIAGNOSIS — N920 Excessive and frequent menstruation with regular cycle: Secondary | ICD-10-CM | POA: Diagnosis not present

## 2020-04-24 DIAGNOSIS — N925 Other specified irregular menstruation: Secondary | ICD-10-CM | POA: Diagnosis not present

## 2020-07-05 DIAGNOSIS — N764 Abscess of vulva: Secondary | ICD-10-CM | POA: Diagnosis not present

## 2020-08-14 DIAGNOSIS — Z131 Encounter for screening for diabetes mellitus: Secondary | ICD-10-CM | POA: Diagnosis not present

## 2020-08-14 DIAGNOSIS — R42 Dizziness and giddiness: Secondary | ICD-10-CM | POA: Diagnosis not present

## 2020-08-14 DIAGNOSIS — D500 Iron deficiency anemia secondary to blood loss (chronic): Secondary | ICD-10-CM | POA: Diagnosis not present

## 2020-08-14 DIAGNOSIS — R202 Paresthesia of skin: Secondary | ICD-10-CM | POA: Diagnosis not present

## 2020-08-14 DIAGNOSIS — R200 Anesthesia of skin: Secondary | ICD-10-CM | POA: Diagnosis not present

## 2020-08-14 DIAGNOSIS — E559 Vitamin D deficiency, unspecified: Secondary | ICD-10-CM | POA: Diagnosis not present

## 2020-08-21 DIAGNOSIS — D500 Iron deficiency anemia secondary to blood loss (chronic): Secondary | ICD-10-CM | POA: Diagnosis not present

## 2020-08-21 DIAGNOSIS — E538 Deficiency of other specified B group vitamins: Secondary | ICD-10-CM | POA: Diagnosis not present

## 2020-09-21 DIAGNOSIS — Z03818 Encounter for observation for suspected exposure to other biological agents ruled out: Secondary | ICD-10-CM | POA: Diagnosis not present

## 2020-09-21 DIAGNOSIS — Z20822 Contact with and (suspected) exposure to covid-19: Secondary | ICD-10-CM | POA: Diagnosis not present

## 2020-09-25 DIAGNOSIS — Z20822 Contact with and (suspected) exposure to covid-19: Secondary | ICD-10-CM | POA: Diagnosis not present

## 2020-09-26 DIAGNOSIS — L280 Lichen simplex chronicus: Secondary | ICD-10-CM | POA: Diagnosis not present

## 2020-09-26 DIAGNOSIS — D485 Neoplasm of uncertain behavior of skin: Secondary | ICD-10-CM | POA: Diagnosis not present

## 2020-10-12 DIAGNOSIS — N644 Mastodynia: Secondary | ICD-10-CM | POA: Diagnosis not present

## 2020-10-16 DIAGNOSIS — R922 Inconclusive mammogram: Secondary | ICD-10-CM | POA: Diagnosis not present

## 2020-10-16 DIAGNOSIS — R928 Other abnormal and inconclusive findings on diagnostic imaging of breast: Secondary | ICD-10-CM | POA: Diagnosis not present

## 2020-10-16 DIAGNOSIS — N644 Mastodynia: Secondary | ICD-10-CM | POA: Diagnosis not present

## 2020-11-02 DIAGNOSIS — R350 Frequency of micturition: Secondary | ICD-10-CM | POA: Diagnosis not present

## 2020-11-02 DIAGNOSIS — Z13220 Encounter for screening for lipoid disorders: Secondary | ICD-10-CM | POA: Diagnosis not present

## 2020-11-02 DIAGNOSIS — E559 Vitamin D deficiency, unspecified: Secondary | ICD-10-CM | POA: Diagnosis not present

## 2020-11-02 DIAGNOSIS — D500 Iron deficiency anemia secondary to blood loss (chronic): Secondary | ICD-10-CM | POA: Diagnosis not present

## 2020-11-02 DIAGNOSIS — Z1159 Encounter for screening for other viral diseases: Secondary | ICD-10-CM | POA: Diagnosis not present

## 2020-11-02 DIAGNOSIS — E538 Deficiency of other specified B group vitamins: Secondary | ICD-10-CM | POA: Diagnosis not present

## 2020-11-02 DIAGNOSIS — Z0000 Encounter for general adult medical examination without abnormal findings: Secondary | ICD-10-CM | POA: Diagnosis not present

## 2020-11-02 DIAGNOSIS — Z23 Encounter for immunization: Secondary | ICD-10-CM | POA: Diagnosis not present

## 2020-11-02 DIAGNOSIS — Q892 Congenital malformations of other endocrine glands: Secondary | ICD-10-CM | POA: Diagnosis not present

## 2020-11-06 DIAGNOSIS — Z0000 Encounter for general adult medical examination without abnormal findings: Secondary | ICD-10-CM | POA: Diagnosis not present

## 2020-11-20 DIAGNOSIS — R3121 Asymptomatic microscopic hematuria: Secondary | ICD-10-CM | POA: Diagnosis not present

## 2020-11-28 ENCOUNTER — Other Ambulatory Visit: Payer: Self-pay | Admitting: Urology

## 2020-11-28 ENCOUNTER — Other Ambulatory Visit (HOSPITAL_COMMUNITY): Payer: Self-pay | Admitting: Urology

## 2020-11-28 DIAGNOSIS — R3129 Other microscopic hematuria: Secondary | ICD-10-CM

## 2020-12-05 ENCOUNTER — Ambulatory Visit (HOSPITAL_COMMUNITY)
Admission: RE | Admit: 2020-12-05 | Discharge: 2020-12-05 | Disposition: A | Discharge status: Home / Self Care | Payer: Federal, State, Local not specified - PPO | Source: Ambulatory Visit | Attending: Urology | Admitting: Urology

## 2020-12-05 ENCOUNTER — Other Ambulatory Visit: Payer: Self-pay

## 2020-12-05 DIAGNOSIS — R3129 Other microscopic hematuria: Secondary | ICD-10-CM | POA: Insufficient documentation

## 2020-12-11 DIAGNOSIS — Z01419 Encounter for gynecological examination (general) (routine) without abnormal findings: Secondary | ICD-10-CM | POA: Diagnosis not present

## 2020-12-11 DIAGNOSIS — Z6826 Body mass index (BMI) 26.0-26.9, adult: Secondary | ICD-10-CM | POA: Diagnosis not present

## 2020-12-11 DIAGNOSIS — Z124 Encounter for screening for malignant neoplasm of cervix: Secondary | ICD-10-CM | POA: Diagnosis not present

## 2020-12-24 DIAGNOSIS — N925 Other specified irregular menstruation: Secondary | ICD-10-CM | POA: Diagnosis not present

## 2020-12-25 DIAGNOSIS — R3121 Asymptomatic microscopic hematuria: Secondary | ICD-10-CM | POA: Diagnosis not present

## 2021-05-02 DIAGNOSIS — N920 Excessive and frequent menstruation with regular cycle: Secondary | ICD-10-CM | POA: Diagnosis not present

## 2021-05-02 DIAGNOSIS — E538 Deficiency of other specified B group vitamins: Secondary | ICD-10-CM | POA: Diagnosis not present

## 2021-05-02 DIAGNOSIS — E559 Vitamin D deficiency, unspecified: Secondary | ICD-10-CM | POA: Diagnosis not present

## 2021-05-02 DIAGNOSIS — D500 Iron deficiency anemia secondary to blood loss (chronic): Secondary | ICD-10-CM | POA: Diagnosis not present

## 2021-05-14 DIAGNOSIS — R1030 Lower abdominal pain, unspecified: Secondary | ICD-10-CM | POA: Diagnosis not present

## 2021-05-14 DIAGNOSIS — N390 Urinary tract infection, site not specified: Secondary | ICD-10-CM | POA: Diagnosis not present

## 2021-08-14 DIAGNOSIS — R591 Generalized enlarged lymph nodes: Secondary | ICD-10-CM | POA: Diagnosis not present

## 2021-08-14 DIAGNOSIS — M2550 Pain in unspecified joint: Secondary | ICD-10-CM | POA: Diagnosis not present

## 2021-09-16 DIAGNOSIS — M25542 Pain in joints of left hand: Secondary | ICD-10-CM | POA: Diagnosis not present

## 2021-09-16 DIAGNOSIS — A770 Spotted fever due to Rickettsia rickettsii: Secondary | ICD-10-CM | POA: Diagnosis not present

## 2021-09-16 DIAGNOSIS — M25541 Pain in joints of right hand: Secondary | ICD-10-CM | POA: Diagnosis not present

## 2021-10-01 DIAGNOSIS — M2550 Pain in unspecified joint: Secondary | ICD-10-CM | POA: Diagnosis not present

## 2021-10-01 DIAGNOSIS — R609 Edema, unspecified: Secondary | ICD-10-CM | POA: Diagnosis not present

## 2021-10-01 DIAGNOSIS — R202 Paresthesia of skin: Secondary | ICD-10-CM | POA: Diagnosis not present

## 2021-10-01 DIAGNOSIS — A770 Spotted fever due to Rickettsia rickettsii: Secondary | ICD-10-CM | POA: Diagnosis not present

## 2021-10-01 DIAGNOSIS — Z23 Encounter for immunization: Secondary | ICD-10-CM | POA: Diagnosis not present

## 2021-10-08 DIAGNOSIS — A770 Spotted fever due to Rickettsia rickettsii: Secondary | ICD-10-CM | POA: Diagnosis not present

## 2021-10-15 ENCOUNTER — Other Ambulatory Visit: Payer: Self-pay | Admitting: Family Medicine

## 2021-10-15 DIAGNOSIS — R202 Paresthesia of skin: Secondary | ICD-10-CM

## 2021-10-26 DIAGNOSIS — Z1231 Encounter for screening mammogram for malignant neoplasm of breast: Secondary | ICD-10-CM | POA: Diagnosis not present

## 2021-10-30 ENCOUNTER — Other Ambulatory Visit: Payer: Self-pay

## 2021-10-30 ENCOUNTER — Ambulatory Visit
Admission: RE | Admit: 2021-10-30 | Discharge: 2021-10-30 | Disposition: A | Discharge status: Home / Self Care | Payer: Federal, State, Local not specified - PPO | Source: Ambulatory Visit | Attending: Family Medicine | Admitting: Family Medicine

## 2021-10-30 DIAGNOSIS — R202 Paresthesia of skin: Secondary | ICD-10-CM | POA: Diagnosis not present

## 2021-10-30 DIAGNOSIS — E348 Other specified endocrine disorders: Secondary | ICD-10-CM | POA: Diagnosis not present

## 2021-10-30 MED ORDER — GADOBENATE DIMEGLUMINE 529 MG/ML IV SOLN
13.0000 mL | Freq: Once | INTRAVENOUS | Status: AC | PRN
  Administered 2021-10-30: 13 mL via INTRAVENOUS

## 2021-11-06 DIAGNOSIS — R202 Paresthesia of skin: Secondary | ICD-10-CM | POA: Diagnosis not present

## 2021-11-06 DIAGNOSIS — Z0000 Encounter for general adult medical examination without abnormal findings: Secondary | ICD-10-CM | POA: Diagnosis not present

## 2021-11-06 DIAGNOSIS — Z13220 Encounter for screening for lipoid disorders: Secondary | ICD-10-CM | POA: Diagnosis not present

## 2021-12-12 ENCOUNTER — Encounter: Payer: Self-pay | Admitting: *Deleted

## 2021-12-17 ENCOUNTER — Encounter: Payer: Self-pay | Admitting: Neurology

## 2021-12-17 ENCOUNTER — Other Ambulatory Visit: Payer: Self-pay

## 2021-12-17 ENCOUNTER — Ambulatory Visit: Payer: Federal, State, Local not specified - PPO | Admitting: Neurology

## 2021-12-17 VITALS — BP 111/67 | HR 65 | Ht 62.0 in | Wt 147.2 lb

## 2021-12-17 DIAGNOSIS — R200 Anesthesia of skin: Secondary | ICD-10-CM

## 2021-12-17 DIAGNOSIS — R202 Paresthesia of skin: Secondary | ICD-10-CM

## 2021-12-17 DIAGNOSIS — G9331 Postviral fatigue syndrome: Secondary | ICD-10-CM

## 2021-12-17 DIAGNOSIS — M79642 Pain in left hand: Secondary | ICD-10-CM

## 2021-12-17 DIAGNOSIS — M79641 Pain in right hand: Secondary | ICD-10-CM

## 2021-12-17 DIAGNOSIS — R29898 Other symptoms and signs involving the musculoskeletal system: Secondary | ICD-10-CM

## 2021-12-17 DIAGNOSIS — M5410 Radiculopathy, site unspecified: Secondary | ICD-10-CM

## 2021-12-17 DIAGNOSIS — M542 Cervicalgia: Secondary | ICD-10-CM

## 2021-12-17 DIAGNOSIS — M5382 Other specified dorsopathies, cervical region: Secondary | ICD-10-CM

## 2021-12-17 DIAGNOSIS — W57XXXA Bitten or stung by nonvenomous insect and other nonvenomous arthropods, initial encounter: Secondary | ICD-10-CM

## 2021-12-17 NOTE — Progress Notes (Signed)
[facility]    Provider:  Dr Jaynee Eagles Requesting Provider: Caren Macadam, MD Primary Care Provider:  Lois Huxley, PA  CC:  multiple neurologic symptoms  HPI:  Karina Mitchell is a 45 y.o. female here as requested by Caren Macadam, MD for left-sided paresthesias. 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 Symptoms started mid august 202, she went to [facility] and she was having pin in the fingers, last august, she felt her right hand get weak, she had a pen in her hand and trying to write and couldn't do it in either hand, brief. By thatnight she started to feel pins and needles in the hand a foot. Lasted several weeks and then had vertigo which was severe than before, lasting longer, it all went away until August 2022 when she started to feel burning in the fingers in the middle of thenight left hand, bad burning waking her up for a week. She started having the burning in the pinky and thumb of the left hand. She found an engorged tick in her car and she tested+ for RMSf but ID said it was a false positive. Then in September had sharp, stabbing pain in her head on the left side and on the middle of the chest then had numbness and tinging left hand and foot started with antibiotics. In Oct 2022 she would get a stabbing pain also on theleft side of her head same time, By October she hadpressure in her head, she does not get headaches, feelinghead pressure, bilterally in a band, her neck felt weak like it could not hold up her head, then pressure behind the nose. Everything would come and go. Everything has been on the left sid mostly, she feels better in her head. Occ stabbing pain and shooting pain in the thumb. She had some aphasia in august, resolved since then.   Reviewed notes, labs and imaging from outside physicians, which showed :  MRI brain 10/30/2021: IMPRESSION: No evidence of acute intracranial abnormality.   5 mm pineal cyst.   Otherwise  unremarkable MRI appearance of the brain for age  Personally reviewed images and agree    Review of Systems: Patient complains of symptoms per HPI as well as the following symptoms paresthesias. Pertinent negatives and positives per HPI. All others negative.   Social History   Socioeconomic History   Marital status: Married    Spouse name: Not on file   Number of children: Not on file   Years of education: Not on file   Highest education level: Not on file  Occupational History   Not on file  Tobacco Use   Smoking status: Never   Smokeless tobacco: Never  Vaping Use   Vaping Use: Never used  Substance and Sexual Activity   Alcohol use: No   Drug use: No   Sexual activity: Yes    Birth control/protection: Surgical  Other Topics Concern   Not on file  Social History Narrative   Not on file   Social Determinants of Health   Financial Resource Strain: Not on file  Food Insecurity: Not on file  Transportation Needs: Not on file  Physical Activity: Not on file  Stress: Not on file  Social Connections: Not on file  Intimate Partner Violence: Not on file    Family History  Problem Relation Age of Onset   Hypertension Mother    Hypertension Father    Stroke Maternal Grandmother    Hypertension Maternal  Grandmother    Diabetes Maternal Grandmother    Stroke Paternal Grandmother    Diabetes Paternal Grandmother     Past Medical History:  Diagnosis Date   Allergic rhinitis    Clotting disorder ([place])    related to pregnancy/childbirth ;  only was on lovenox and ASA temporarily to reduce risk of clot formation    Constipation    improved   Dyslipidemia    Endometriosis    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   Past Surgical History:  Procedure Laterality Date   CESAREAN SECTION     x2    CHOLECYSTECTOMY N/A 10/01/2017   Procedure: LAPAROSCOPIC CHOLECYSTECTOMY  WITH INTRAOPERATIVE CHOLANGIOGRAM;  Surgeon: Autumn Messing III, MD;  Location: [facility];  Service: General;  Laterality: N/A;   LAPAROSCOPIC ENDOMETRIOSIS FULGURATION  2007   TUBAL LIGATION      Current Outpatient Medications  Medication Sig Dispense Refill   Cholecalciferol (VITAMIN D) 50 MCG (2000 UT) CAPS Take 1 capsule by mouth daily.     Cyanocobalamin (VITAMIN B-12 PO) Take by mouth daily.     Ferrous Sulfate (IRON) 28 MG TABS Take 1 tablet by mouth daily.     fexofenadine (ALLEGRA) 180 MG tablet Take 180 mg by mouth daily as needed for allergies or rhinitis.     Multiple Vitamin (MULTIVITAMIN WITH MINERALS) TABS tablet Take 1 tablet by mouth daily.     naproxen (NAPROSYN) 500 MG tablet Take 500 mg by mouth 2 (two) times daily as needed for mild pain.   6   Tetrahydrozoline-Zn Sulfate (EYE DROPS ALLERGY RELIEF OP) Apply 1 drop to eye daily.     No current facility-administered medications for this visit.    Allergies as of 12/17/2021 - Review Complete 12/17/2021  Allergen Reaction Noted   Contrast media [iodinated contrast media] Other (See Comments) 10/07/2018   Other  12/12/2021   Shellfish allergy Swelling 09/22/2017    Vitals: BP 111/67    Pulse 65    Ht 5\' 2"  (1.575 m)    Wt 147 lb 3.2 oz (66.8 kg)    BMI 26.92 kg/m  Last Weight:  Wt Readings from Last 1 Encounters:  12/17/21 147 lb 3.2 oz (66.8 kg)   Last Height:   Ht Readings from Last 1 Encounters:  12/17/21 5\' 2"  (1.575 m)     Physical exam: Exam: Gen: NAD, conversant, well nourised, well groomed                     CV: RRR, no MRG. No Carotid Bruits. No peripheral edema, warm, nontender Eyes: Conjunctivae clear without exudates or hemorrhage  Neuro: Detailed Neurologic Exam  Speech:    Speech is normal; fluent and spontaneous with normal comprehension.  Cognition:    The patient is oriented to person, place, and time;     recent and remote memory intact;     language fluent;     normal attention,  concentration,     fund of knowledge Cranial Nerves:    The pupils are equal, round, and reactive to light. The fundi are normal and spontaneous venous pulsations are present. Visual fields are full to finger confrontation. Extraocular movements are intact. Trigeminal sensation is intact and the muscles of mastication are normal. The face is symmetric. The palate elevates in the midline. Hearing intact. Voice is normal. Shoulder shrug is normal. The  tongue has normal motion without fasciculations.   Coordination:    Normal finger to nose and heel to shin. Normal rapid alternating movements.   Gait:    Heel-toe and tandem gait are normal.   Motor Observation:    No asymmetry, no atrophy, and no involuntary movements noted. Tone:    Normal muscle tone.    Posture:    Posture is normal. normal erect    Strength:    Strength is V/V in the upper and lower limbs.      Sensation: intact to LT     Reflex Exam:  DTR's:    Deep tendon reflexes in the upper and lower extremities are normal bilaterally.   Toes:    The toes are downgoing bilaterally.   Clonus:    Clonus is absent.    Assessment/Plan:  42 year year old with a strange constellation of symptoms including lest-sided arm and leg numbness, paresthesias and pain in the setting of a virus and tick bite, also pain, paresthesias and burning in the fingers, weakness bilaterally in the hands, paresthesias in the left foot, sharp stabbing pains, neck weakness and pain, shooting pains.  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  MRI cervical spine w/wo contrast Emg/ncs bilateral uppers  Orders Placed This  Encounter  Procedures   MR CERVICAL SPINE W WO CONTRAST   NCV with EMG(electromyography)   No orders of the defined types were placed in this encounter.   Cc: Caren Macadam, MD,  Lois Huxley, [place]  Sarina Ill, MD  [facility] [address] [place], [place] [address]  Phone [phone] Fax [phone]

## 2021-12-17 NOTE — Patient Instructions (Signed)
MRI cervical spine w/wo contrast Emg/ncs bilateral uppers  Electromyoneurogram Electromyoneurogram is a test to check how well your muscles and nerves are working. This procedure includes the combined use of electromyogram (EMG) and nerve conduction study (NCS). EMG is used to evaluate muscles and the nerves that control those muscles. NCS, which is also called electroneurogram, measures how well your nerves conduct electricity. The procedures should be done together to check if your muscles and nerves are healthy. If the results of the tests are abnormal, this may indicate disease or injury, such as a neuromuscular disease or peripheral nerve damage. Tell a health care provider about: Any allergies you have. All medicines you are taking, including vitamins, herbs, eye drops, creams, and over-the-counter medicines. Any bleeding problems you have. Any surgeries you have had. Any medical conditions you have. What are the risks? Generally, this is a safe procedure. However, problems may occur, including: Bleeding or bruising. Infection where the electrodes were inserted. What happens before the test? Medicines Take all of your usually prescribed medications before this testing is performed. Do not stop your blood thinners unless advised by your prescribing physician. General instructions Your health care provider may ask you to warm the limb that will be checked with warm water, hot pack, or wrapping the limb in a blanket. Do not use lotions or creams on the same day that you will be having the procedure. What happens during the test? For EMG  Your health care provider will ask you to stay in a position so that the muscle being studied can be accessed. You will be sitting or lying down. You may be given a medicine to numb the area (local anesthetic) and the skin will be disinfected. A very thin needle that has an electrode will be inserted into your muscle, one muscle at a time. Typically,  multiple muscles are evaluated during a single study. Another small electrode will be placed on your skin near the muscle. Your health care provider will ask you to continue to remain still. The electrodes will record the electrical activity of your muscles. You may see this on a monitor or hear it in the room. After your muscles have been studied at rest, your health care provider will ask you to contract or flex your muscles. The electrodes will record the electrical activity of your muscles. Your health care provider will remove the electrodes and the electrode needle when the procedure is finished. The procedure may vary among health care providers and hospitals. For NCS  An electrode that records your nerve activity (recording electrode) will be placed on your skin by the muscle that is being studied. An electrode that is used as a reference (reference electrode) will be placed near the recording electrode. A paste or gel will be applied to your skin between the recording electrode and the reference electrode. Your nerve will be stimulated with a mild shock. The speed of the nerves and strength of response is recorded by the electrodes. Your health care provider will remove the electrodes and the gel when the procedure is finished. The procedure may vary among health care providers and hospitals. What can I expect after the test? It is up to you to get your test results. Ask your health care provider, or the department that is doing the test, when your results will be ready. Your health care provider may: Give you medicines for any pain. Monitor the insertion sites to make sure that bleeding stops. You should be able to  drive yourself to and from the test. Discomfort can persist for a few hours after the test, but should be better the next day. Contact a health care provider if: You have swelling, redness, or drainage at any of the insertion sites. Summary Electromyoneurogram is a test  to check how well your muscles and nerves are working. If the results of the tests are abnormal, this may indicate disease or injury. This is a safe procedure. However, problems may occur, such as bleeding and infection. Your health care provider will do two tests to complete this procedure. One checks your muscles (EMG) and another checks your nerves (NCS). It is up to you to get your test results. Ask your health care provider, or the department that is doing the test, when your results will be ready. This information is not intended to replace advice given to you by your health care provider. Make sure you discuss any questions you have with your health care provider. Document Revised: 08/21/2021 Document Reviewed: 07/21/2021 Elsevier Patient Education  [employer].

## 2021-12-24 DIAGNOSIS — Z124 Encounter for screening for malignant neoplasm of cervix: Secondary | ICD-10-CM | POA: Diagnosis not present

## 2021-12-24 DIAGNOSIS — Z6827 Body mass index (BMI) 27.0-27.9, adult: Secondary | ICD-10-CM | POA: Diagnosis not present

## 2021-12-24 DIAGNOSIS — Z01419 Encounter for gynecological examination (general) (routine) without abnormal findings: Secondary | ICD-10-CM | POA: Diagnosis not present

## 2022-01-07 ENCOUNTER — Ambulatory Visit
Admission: RE | Admit: 2022-01-07 | Discharge: 2022-01-07 | Disposition: A | Discharge status: Home / Self Care | Payer: Federal, State, Local not specified - PPO | Source: Ambulatory Visit | Attending: Neurology | Admitting: Neurology

## 2022-01-07 ENCOUNTER — Other Ambulatory Visit: Payer: Self-pay

## 2022-01-07 DIAGNOSIS — R202 Paresthesia of skin: Secondary | ICD-10-CM

## 2022-01-07 DIAGNOSIS — R200 Anesthesia of skin: Secondary | ICD-10-CM

## 2022-01-07 DIAGNOSIS — M542 Cervicalgia: Secondary | ICD-10-CM

## 2022-01-07 DIAGNOSIS — M5382 Other specified dorsopathies, cervical region: Secondary | ICD-10-CM

## 2022-01-07 DIAGNOSIS — W57XXXA Bitten or stung by nonvenomous insect and other nonvenomous arthropods, initial encounter: Secondary | ICD-10-CM

## 2022-01-07 DIAGNOSIS — R29898 Other symptoms and signs involving the musculoskeletal system: Secondary | ICD-10-CM

## 2022-01-07 DIAGNOSIS — M79642 Pain in left hand: Secondary | ICD-10-CM

## 2022-01-07 DIAGNOSIS — M5410 Radiculopathy, site unspecified: Secondary | ICD-10-CM

## 2022-01-07 DIAGNOSIS — G9331 Postviral fatigue syndrome: Secondary | ICD-10-CM

## 2022-01-07 MED ORDER — GADOBENATE DIMEGLUMINE 529 MG/ML IV SOLN
13.0000 mL | Freq: Once | INTRAVENOUS | Status: AC | PRN
  Administered 2022-01-07: 13 mL via INTRAVENOUS

## 2022-01-18 IMAGING — US US RENAL
1 series · 14 of 25 positions shown · non-contrast
Comparison: Ultrasound 08/07/2017

CLINICAL DATA: Microscopic hematuria

EXAM:
RENAL / URINARY TRACT ULTRASOUND COMPLETE

[Series 1: us renal · 14 of 28 slices shown]
[im 1/28]
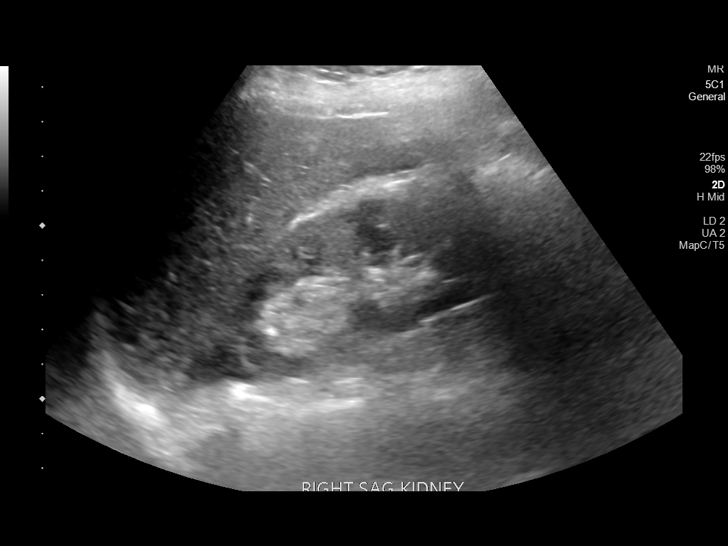
[im 3/28]
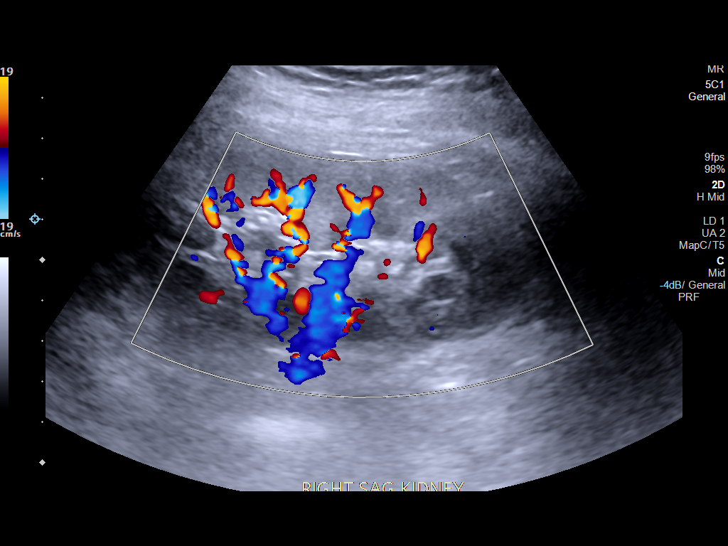
[im 5/28]
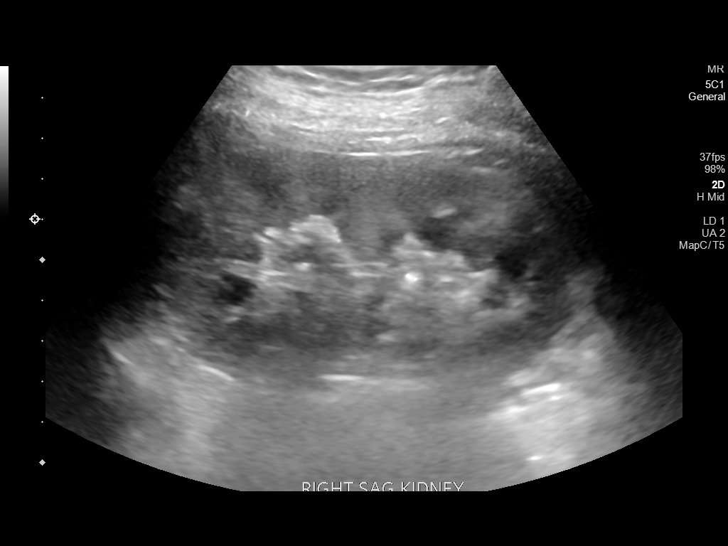
[im 7/28]
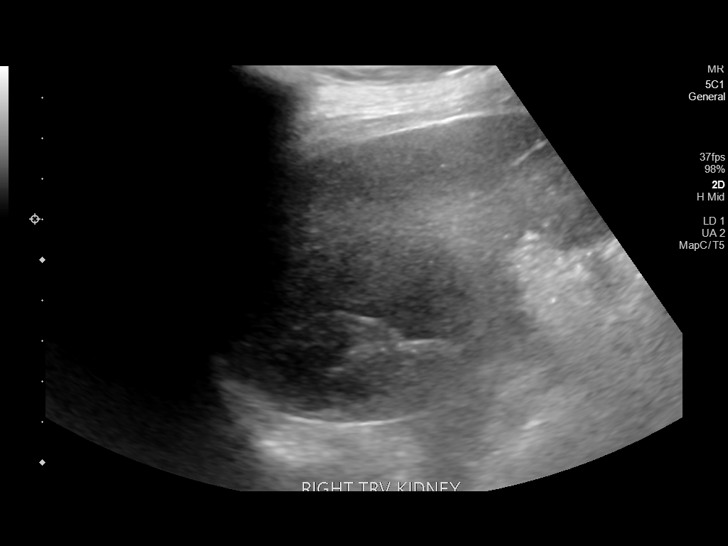
[im 10/28]
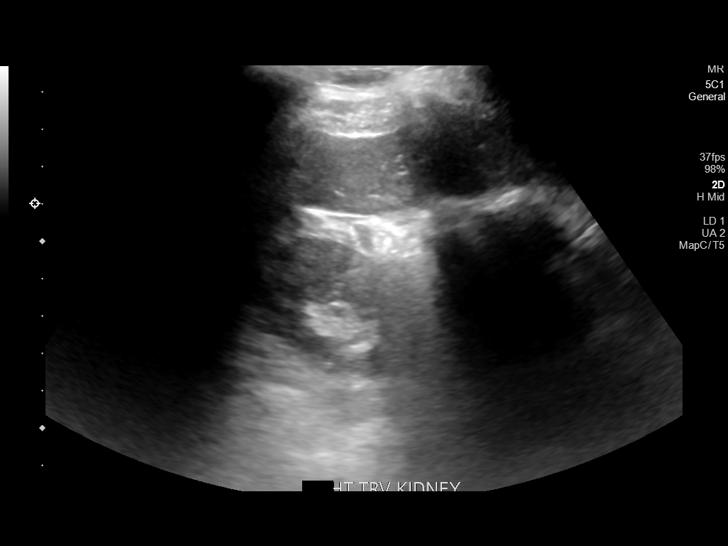
[im 11/28]
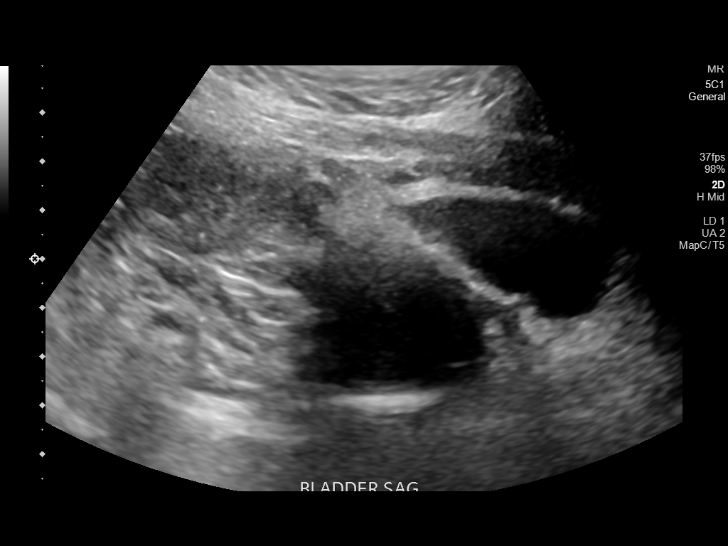
[im 13/28]
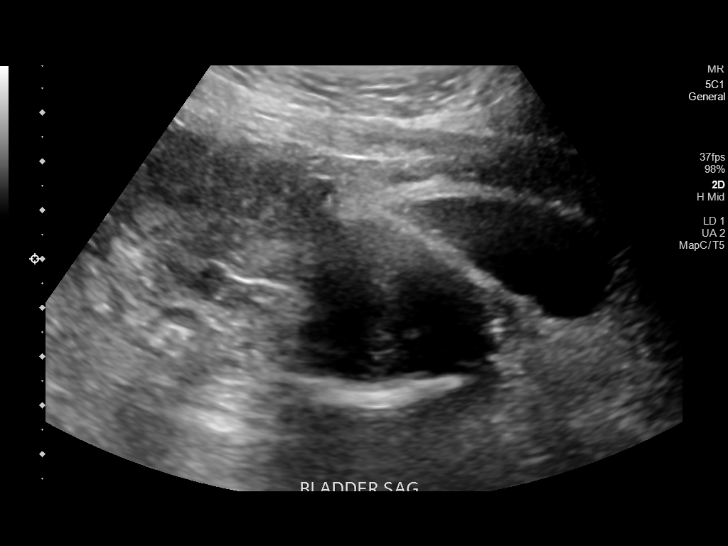
[im 15/28]
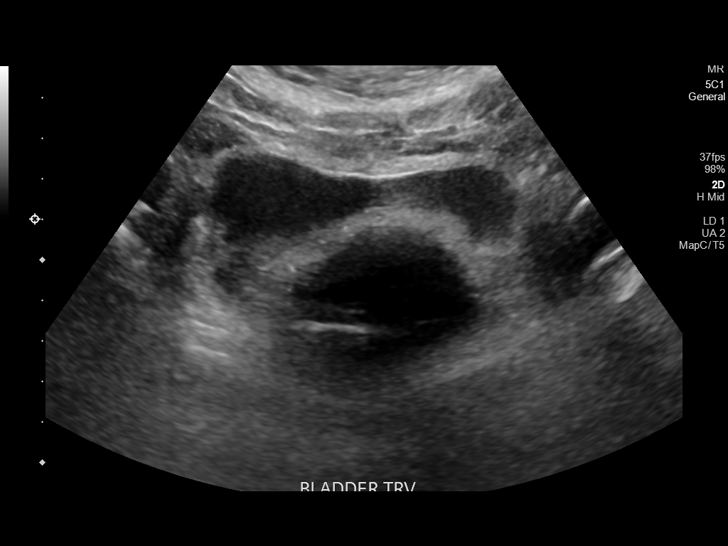
[im 17/28]
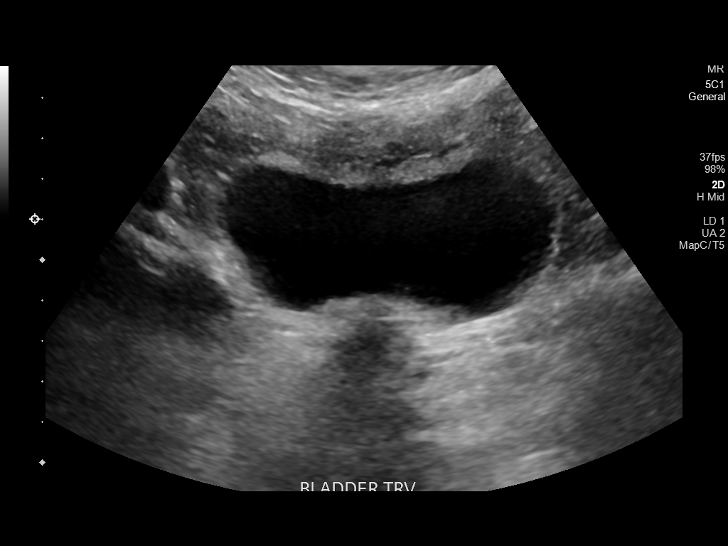
[im 19/28]
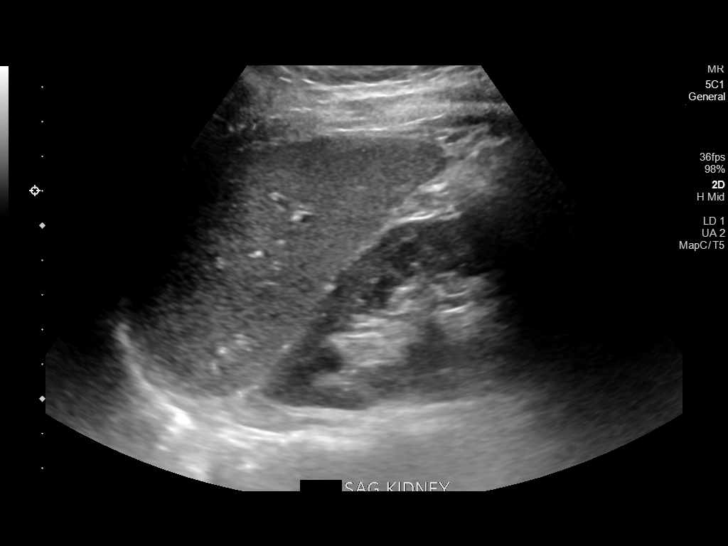
[im 21/28]
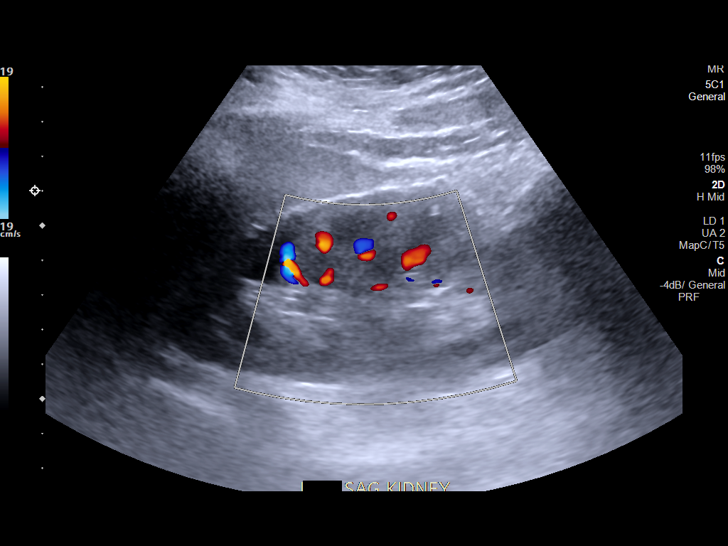
[im 23/28]
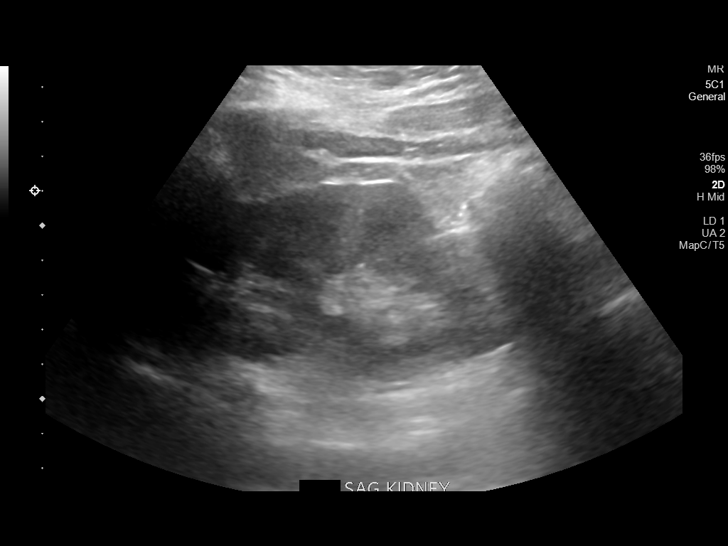
[im 25/28]
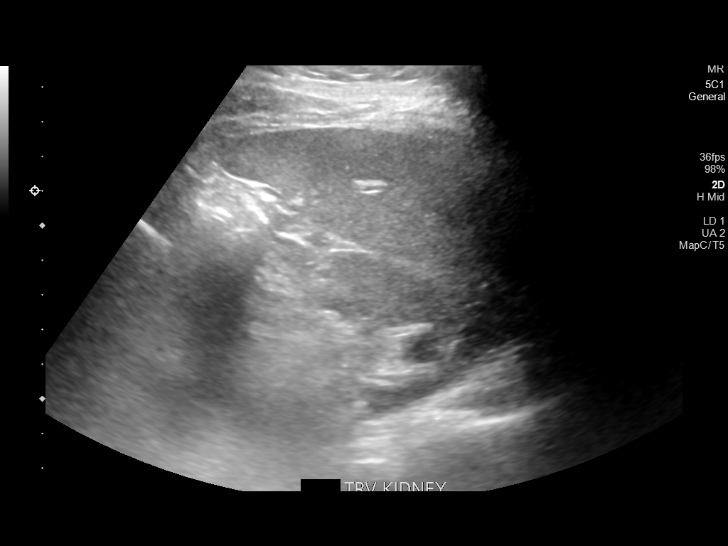
[im 28/28]
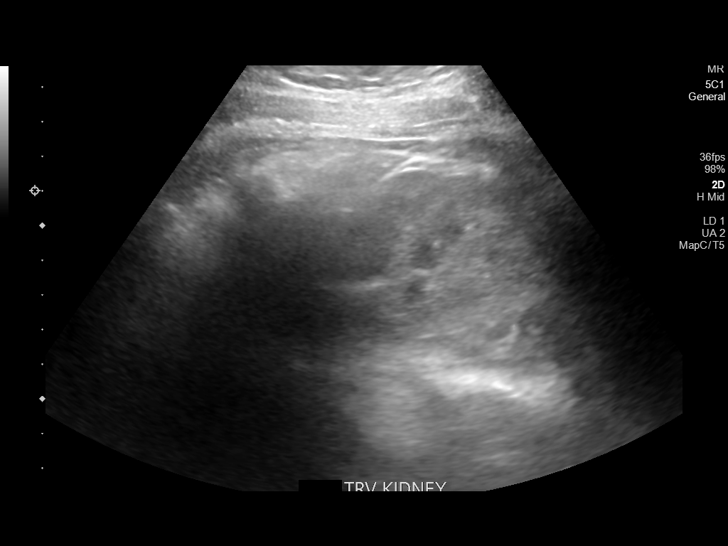

[14 of 25 positions shown; findings below may reference images not displayed]

FINDINGS: Right Kidney:

Renal measurements: 10.3 x 5.3 x 5.1 cm = volume: 147 mL.
Echogenicity within normal limits. No mass or hydronephrosis
visualized.

Left Kidney:

Renal measurements: 11.1 x 5.5 x 4.6 cm = volume: 147 mL.
Echogenicity within normal limits. No mass or hydronephrosis
visualized.

Bladder:

Appears normal for degree of bladder distention.

Other:

None.
IMPRESSION: Negative examination

## 2022-02-13 ENCOUNTER — Encounter: Payer: Federal, State, Local not specified - PPO | Admitting: Neurology

## 2022-05-26 DIAGNOSIS — K635 Polyp of colon: Secondary | ICD-10-CM | POA: Diagnosis not present

## 2022-05-26 DIAGNOSIS — K6289 Other specified diseases of anus and rectum: Secondary | ICD-10-CM | POA: Diagnosis not present

## 2022-05-26 DIAGNOSIS — Z1211 Encounter for screening for malignant neoplasm of colon: Secondary | ICD-10-CM | POA: Diagnosis not present

## 2022-09-21 DIAGNOSIS — E061 Subacute thyroiditis: Secondary | ICD-10-CM

## 2022-09-21 HISTORY — DX: Subacute thyroiditis: E06.1

## 2022-10-18 DIAGNOSIS — R5383 Other fatigue: Secondary | ICD-10-CM | POA: Diagnosis not present

## 2022-10-18 DIAGNOSIS — M542 Cervicalgia: Secondary | ICD-10-CM | POA: Diagnosis not present

## 2022-10-22 DIAGNOSIS — E0789 Other specified disorders of thyroid: Secondary | ICD-10-CM | POA: Diagnosis not present

## 2022-10-22 DIAGNOSIS — R946 Abnormal results of thyroid function studies: Secondary | ICD-10-CM | POA: Diagnosis not present

## 2022-10-23 ENCOUNTER — Other Ambulatory Visit: Payer: Self-pay | Admitting: Family Medicine

## 2022-10-23 DIAGNOSIS — E0789 Other specified disorders of thyroid: Secondary | ICD-10-CM

## 2022-10-27 ENCOUNTER — Ambulatory Visit
Admission: RE | Admit: 2022-10-27 | Discharge: 2022-10-27 | Disposition: A | Discharge status: Home / Self Care | Payer: Federal, State, Local not specified - PPO | Source: Ambulatory Visit | Attending: Family Medicine | Admitting: Family Medicine

## 2022-10-27 DIAGNOSIS — E041 Nontoxic single thyroid nodule: Secondary | ICD-10-CM | POA: Diagnosis not present

## 2022-10-27 DIAGNOSIS — E0789 Other specified disorders of thyroid: Secondary | ICD-10-CM

## 2022-10-28 ENCOUNTER — Other Ambulatory Visit: Payer: Self-pay | Admitting: Family Medicine

## 2022-10-28 DIAGNOSIS — E041 Nontoxic single thyroid nodule: Secondary | ICD-10-CM

## 2022-11-03 ENCOUNTER — Other Ambulatory Visit (HOSPITAL_COMMUNITY)
Admission: RE | Admit: 2022-11-03 | Discharge: 2022-11-03 | Disposition: A | Discharge status: Home / Self Care | Payer: Federal, State, Local not specified - PPO | Source: Ambulatory Visit | Attending: Family Medicine | Admitting: Family Medicine

## 2022-11-03 ENCOUNTER — Ambulatory Visit
Admission: RE | Admit: 2022-11-03 | Discharge: 2022-11-03 | Disposition: A | Discharge status: Home / Self Care | Payer: Federal, State, Local not specified - PPO | Source: Ambulatory Visit | Attending: Family Medicine | Admitting: Family Medicine

## 2022-11-03 DIAGNOSIS — E041 Nontoxic single thyroid nodule: Secondary | ICD-10-CM | POA: Insufficient documentation

## 2022-11-03 NOTE — Procedures (Signed)
Interventional Radiology Procedure Note  Procedure: US guided FNA of right thyroid nodule, with AFIRMA .  Complications: None  Recommendations:  - routine wound care - follow up path    Signed,  Dulcy Fanny. Earleen Newport, DO

## 2022-11-04 DIAGNOSIS — E041 Nontoxic single thyroid nodule: Secondary | ICD-10-CM | POA: Diagnosis not present

## 2022-11-04 DIAGNOSIS — N951 Menopausal and female climacteric states: Secondary | ICD-10-CM | POA: Diagnosis not present

## 2022-11-04 DIAGNOSIS — E0590 Thyrotoxicosis, unspecified without thyrotoxic crisis or storm: Secondary | ICD-10-CM | POA: Diagnosis not present

## 2022-11-05 LAB — CYTOLOGY - NON PAP

## 2022-11-07 DIAGNOSIS — T148XXA Other injury of unspecified body region, initial encounter: Secondary | ICD-10-CM | POA: Diagnosis not present

## 2022-11-13 DIAGNOSIS — Z91041 Radiographic dye allergy status: Secondary | ICD-10-CM | POA: Diagnosis not present

## 2022-11-13 DIAGNOSIS — Z79899 Other long term (current) drug therapy: Secondary | ICD-10-CM | POA: Diagnosis not present

## 2022-11-13 DIAGNOSIS — R000 Tachycardia, unspecified: Secondary | ICD-10-CM | POA: Diagnosis not present

## 2022-11-13 DIAGNOSIS — R002 Palpitations: Secondary | ICD-10-CM | POA: Diagnosis not present

## 2022-11-13 DIAGNOSIS — E0590 Thyrotoxicosis, unspecified without thyrotoxic crisis or storm: Secondary | ICD-10-CM | POA: Diagnosis not present

## 2022-11-13 DIAGNOSIS — R5383 Other fatigue: Secondary | ICD-10-CM | POA: Diagnosis not present

## 2022-11-13 DIAGNOSIS — Z91013 Allergy to seafood: Secondary | ICD-10-CM | POA: Diagnosis not present

## 2022-11-14 DIAGNOSIS — E0590 Thyrotoxicosis, unspecified without thyrotoxic crisis or storm: Secondary | ICD-10-CM | POA: Diagnosis not present

## 2022-11-14 DIAGNOSIS — R000 Tachycardia, unspecified: Secondary | ICD-10-CM | POA: Diagnosis not present

## 2022-11-19 ENCOUNTER — Other Ambulatory Visit (HOSPITAL_COMMUNITY): Payer: Self-pay | Admitting: Family Medicine

## 2022-11-19 ENCOUNTER — Other Ambulatory Visit: Payer: Self-pay | Admitting: Internal Medicine

## 2022-11-19 DIAGNOSIS — E0590 Thyrotoxicosis, unspecified without thyrotoxic crisis or storm: Secondary | ICD-10-CM

## 2022-11-21 DIAGNOSIS — Z13220 Encounter for screening for lipoid disorders: Secondary | ICD-10-CM | POA: Diagnosis not present

## 2022-11-21 DIAGNOSIS — E559 Vitamin D deficiency, unspecified: Secondary | ICD-10-CM | POA: Diagnosis not present

## 2022-11-21 DIAGNOSIS — E0590 Thyrotoxicosis, unspecified without thyrotoxic crisis or storm: Secondary | ICD-10-CM | POA: Diagnosis not present

## 2022-11-21 DIAGNOSIS — R3129 Other microscopic hematuria: Secondary | ICD-10-CM | POA: Diagnosis not present

## 2022-11-21 DIAGNOSIS — Z0000 Encounter for general adult medical examination without abnormal findings: Secondary | ICD-10-CM | POA: Diagnosis not present

## 2022-11-21 DIAGNOSIS — D649 Anemia, unspecified: Secondary | ICD-10-CM | POA: Diagnosis not present

## 2022-11-21 DIAGNOSIS — R7401 Elevation of levels of liver transaminase levels: Secondary | ICD-10-CM | POA: Diagnosis not present

## 2022-11-21 DIAGNOSIS — E538 Deficiency of other specified B group vitamins: Secondary | ICD-10-CM | POA: Diagnosis not present

## 2022-11-26 DIAGNOSIS — Z1231 Encounter for screening mammogram for malignant neoplasm of breast: Secondary | ICD-10-CM | POA: Diagnosis not present

## 2022-11-26 DIAGNOSIS — R92333 Mammographic heterogeneous density, bilateral breasts: Secondary | ICD-10-CM | POA: Diagnosis not present

## 2022-12-08 ENCOUNTER — Encounter (HOSPITAL_COMMUNITY)
Admission: RE | Admit: 2022-12-08 | Discharge: 2022-12-08 | Disposition: A | Discharge status: Home / Self Care | Payer: Federal, State, Local not specified - PPO | Source: Ambulatory Visit | Attending: Internal Medicine | Admitting: Internal Medicine

## 2022-12-08 DIAGNOSIS — E0590 Thyrotoxicosis, unspecified without thyrotoxic crisis or storm: Secondary | ICD-10-CM | POA: Diagnosis not present

## 2022-12-08 MED ORDER — SODIUM IODIDE I-123 7.4 MBQ CAPS
428.0000 | ORAL_CAPSULE | Freq: Once | ORAL | Status: AC
  Administered 2022-12-08: 428 via ORAL

## 2022-12-09 ENCOUNTER — Encounter (HOSPITAL_COMMUNITY)
Admission: RE | Admit: 2022-12-09 | Discharge: 2022-12-09 | Disposition: A | Discharge status: Home / Self Care | Payer: Federal, State, Local not specified - PPO | Source: Ambulatory Visit | Attending: Internal Medicine | Admitting: Internal Medicine

## 2022-12-09 DIAGNOSIS — E0590 Thyrotoxicosis, unspecified without thyrotoxic crisis or storm: Secondary | ICD-10-CM | POA: Diagnosis not present

## 2022-12-10 DIAGNOSIS — E0590 Thyrotoxicosis, unspecified without thyrotoxic crisis or storm: Secondary | ICD-10-CM | POA: Diagnosis not present

## 2022-12-25 DIAGNOSIS — Z01419 Encounter for gynecological examination (general) (routine) without abnormal findings: Secondary | ICD-10-CM | POA: Diagnosis not present

## 2022-12-25 DIAGNOSIS — Z1151 Encounter for screening for human papillomavirus (HPV): Secondary | ICD-10-CM | POA: Diagnosis not present

## 2022-12-25 DIAGNOSIS — Z6826 Body mass index (BMI) 26.0-26.9, adult: Secondary | ICD-10-CM | POA: Diagnosis not present

## 2022-12-29 DIAGNOSIS — Z20828 Contact with and (suspected) exposure to other viral communicable diseases: Secondary | ICD-10-CM | POA: Diagnosis not present

## 2023-01-01 DIAGNOSIS — M2550 Pain in unspecified joint: Secondary | ICD-10-CM | POA: Diagnosis not present

## 2023-01-01 DIAGNOSIS — E0590 Thyrotoxicosis, unspecified without thyrotoxic crisis or storm: Secondary | ICD-10-CM | POA: Diagnosis not present

## 2023-01-07 DIAGNOSIS — D500 Iron deficiency anemia secondary to blood loss (chronic): Secondary | ICD-10-CM | POA: Diagnosis not present

## 2023-01-07 DIAGNOSIS — E039 Hypothyroidism, unspecified: Secondary | ICD-10-CM | POA: Diagnosis not present

## 2023-01-07 DIAGNOSIS — E559 Vitamin D deficiency, unspecified: Secondary | ICD-10-CM | POA: Diagnosis not present

## 2023-01-19 ENCOUNTER — Telehealth: Payer: Self-pay | Admitting: Neurology

## 2023-01-19 ENCOUNTER — Encounter: Payer: Self-pay | Admitting: Neurology

## 2023-01-19 ENCOUNTER — Ambulatory Visit: Payer: Federal, State, Local not specified - PPO | Admitting: Neurology

## 2023-01-19 VITALS — BP 110/70 | HR 76 | Ht 62.0 in | Wt 144.0 lb

## 2023-01-19 DIAGNOSIS — R202 Paresthesia of skin: Secondary | ICD-10-CM | POA: Diagnosis not present

## 2023-01-19 DIAGNOSIS — H9313 Tinnitus, bilateral: Secondary | ICD-10-CM

## 2023-01-19 DIAGNOSIS — R519 Headache, unspecified: Secondary | ICD-10-CM | POA: Diagnosis not present

## 2023-01-19 DIAGNOSIS — R531 Weakness: Secondary | ICD-10-CM

## 2023-01-19 DIAGNOSIS — H539 Unspecified visual disturbance: Secondary | ICD-10-CM

## 2023-01-19 DIAGNOSIS — R200 Anesthesia of skin: Secondary | ICD-10-CM

## 2023-01-19 DIAGNOSIS — R449 Unspecified symptoms and signs involving general sensations and perceptions: Secondary | ICD-10-CM | POA: Diagnosis not present

## 2023-01-19 DIAGNOSIS — D497 Neoplasm of unspecified behavior of endocrine glands and other parts of nervous system: Secondary | ICD-10-CM

## 2023-01-19 DIAGNOSIS — R7309 Other abnormal glucose: Secondary | ICD-10-CM

## 2023-01-19 DIAGNOSIS — H9193 Unspecified hearing loss, bilateral: Secondary | ICD-10-CM

## 2023-01-19 NOTE — Progress Notes (Signed)
[facility]    Provider:  Dr Jaynee Eagles Requesting Provider: Lois Huxley, PA Primary Care Provider:  Lois Huxley, [place]  CC:  multiple neurologic symptoms  Follow-up January 19, 2023: I saw Ms. Fessel in December 2022 (last month) for multiple neurologic symptoms included left-sided paresthesias.  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  MRI of the cervical spine was normal, I reviewed images and agree.  We ordered an EMG on patient but she canceled it because she stated she was feeling better.   She is here again. Left side tingling again. Left arm/hand and foot. No neck pain. More tingling in the left arm and hand. New hearing changes, tinnitus ringing, no sinus issues, no headaches, pineal cyst, she is seeing an endocrinologist for her thyroiditis, left sided numbness, numbness in the left foot more on the top of the foot on the left, left eye vision changes, prior pineal cyst, been doing acupunture and is helping, no low back pain, no knee pain, in the setting of thyroid issues after a virus. Entire arm is tingling. Not face. More noticeable positionally paresthesias on left side. Whole arm, whole hand. And the foot. No back pain or neck pain. No radicular symptoms, no knee pain on the left.  Reviewed notes, labs and imaging from outside physicians, which showed :  11/14/2023: seeing endocrinologist TSH 0.45 - 4.50 mcIU/mL <0.01 Low    10/2022:  CMP showed elevated glucose, slightly elevated alt Cbc showed anemia hgb 9 and elevated platelets Mag and phos nml  11/21/2022: Had yearly and checked iron levels  vitamin d  (nml)  12/2022 ESR 4  HPI 12/17/2021:  Karina Mitchell is a 47 y.o. female here as requested by Lois Huxley, PA for left-sided paresthesias. 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 Symptoms started mid august 202, she went to [facility] and she was having pin in the fingers, last august, she felt her right hand get weak, she had a pen in her hand and trying to write and couldn't do it in either hand, brief. By thatnight she started to feel pins and needles in the hand a foot. Lasted several weeks and then had vertigo which was severe than before, lasting longer, it all went away until August 2022 when she started to feel burning in the fingers in the middle of thenight left hand, bad burning waking her up for a week. She started having the burning in the pinky and thumb of the left hand. She found an engorged tick in her car and she tested+ for RMSf but ID said it was a false positive. Then in September had sharp, stabbing pain in her head on the left side and on the middle of the chest then had numbness and tinging left hand and foot started with antibiotics. In Oct 2022 she would get a stabbing pain also on theleft side of her head same time, By October she hadpressure in her head, she does not get headaches, feelinghead pressure, bilterally in a band, her neck felt weak like it could not hold up her head, then pressure behind the nose. Everything would  come and go. Everything has been on the left sid mostly, she feels better in her head. Occ stabbing pain and shooting pain in the thumb. She had some aphasia in august, resolved since then.   Reviewed notes, labs and imaging from outside physicians, which showed :  MRI brain 10/30/2021: IMPRESSION: No evidence of acute intracranial abnormality.   5 mm pineal cyst.   Otherwise unremarkable MRI appearance of the brain for age  Personally reviewed images and agree    Review of Systems: Patient complains of symptoms per  HPI as well as the following symptoms paresthesias. Pertinent negatives and positives per HPI. All others negative.   Social History   Socioeconomic History   Marital status: Married    Spouse name: Not on file   Number of children: Not on file   Years of education: Not on file   Highest education level: Not on file  Occupational History   Not on file  Tobacco Use   Smoking status: Never   Smokeless tobacco: Never  Vaping Use   Vaping Use: Never used  Substance and Sexual Activity   Alcohol use: No   Drug use: No   Sexual activity: Yes    Birth control/protection: Surgical  Other Topics Concern   Not on file  Social History Narrative   Right handed   Caffeine: 1 cup coffee/day, not much soda   Social Determinants of Health   Financial Resource Strain: Not on file  Food Insecurity: Not on file  Transportation Needs: Not on file  Physical Activity: Not on file  Stress: Not on file  Social Connections: Not on file  Intimate Partner Violence: Not on file    Family History  Problem Relation Age of Onset   Hypertension Mother    Hypertension Father    Stroke Maternal Grandmother    Hypertension Maternal Grandmother    Diabetes Maternal Grandmother    Stroke Paternal Grandmother    Diabetes Paternal Grandmother     Past Medical History:  Diagnosis Date   Allergic rhinitis    Clotting disorder ([place])    related to pregnancy/childbirth ;  only was on lovenox and ASA temporarily to reduce risk of clot formation    Constipation    improved   Dyslipidemia    Endometriosis    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[place])    Hypothyroidism    Subacute thyroiditis 09/2022   happened within a couple days after flu shot per pt report   Vitamin D deficiency     Patient Active Problem List   Diagnosis Date Noted   Left-sided sensory deficit present 01/19/2023   Left-sided weakness 01/19/2023    Past Surgical History:  Procedure Laterality Date   CESAREAN  SECTION     x2    CHOLECYSTECTOMY N/A 10/01/2017   Procedure: LAPAROSCOPIC CHOLECYSTECTOMY WITH INTRAOPERATIVE CHOLANGIOGRAM;  Surgeon: Autumn Messing III, MD;  Location: [facility];  Service: General;  Laterality: N/A;   LAPAROSCOPIC ENDOMETRIOSIS FULGURATION  2007   TUBAL LIGATION      Current Outpatient Medications  Medication Sig Dispense Refill   Cholecalciferol (VITAMIN D) 50 MCG (2000 UT) CAPS Take 1 capsule by mouth daily.     fexofenadine (ALLEGRA) 180 MG tablet Take 180 mg by mouth daily as needed for allergies or rhinitis.     levothyroxine (SYNTHROID) 100 MCG tablet Take 100 mcg by mouth daily before breakfast.     Multiple Vitamin (MULTIVITAMIN WITH MINERALS) TABS tablet Take 1 tablet by mouth  daily.     naproxen (NAPROSYN) 500 MG tablet Take 500 mg by mouth 2 (two) times daily as needed for mild pain.   6   Tetrahydrozoline-Zn Sulfate (EYE DROPS ALLERGY RELIEF OP) Apply 1 drop to eye daily.     No current facility-administered medications for this visit.    Allergies as of 01/19/2023 - Review Complete 01/19/2023  Allergen Reaction Noted   Contrast media [iodinated contrast media] Other (See Comments) 10/07/2018   Other  12/12/2021   Shellfish allergy Swelling 09/22/2017    Vitals: BP 110/70 (BP Location: Right Arm, Patient Position: Sitting)   Pulse 76   Ht '5\' 2"'$  (1.575 m)   Wt 144 lb (65.3 kg)   BMI 26.34 kg/m  Last Weight:  Wt Readings from Last 1 Encounters:  01/19/23 144 lb (65.3 kg)   Last Height:   Ht Readings from Last 1 Encounters:  01/19/23 '5\' 2"'$  (1.575 m)    Physical exam: Exam: Gen: NAD, conversant, well nourised, well groomed                     CV: RRR, no MRG. No Carotid Bruits. No peripheral edema, warm, nontender Eyes: Conjunctivae clear without exudates or hemorrhage  Neuro: Detailed Neurologic Exam  Speech:    Speech is normal; fluent and spontaneous with normal comprehension.  Cognition:    The patient is oriented to person, place, and  time;     recent and remote memory intact;     language fluent;     normal attention, concentration,     fund of knowledge Cranial Nerves:    The pupils are equal, round, and reactive to light. The fundi are normal and spontaneous venous pulsations are present. Visual fields are full to finger confrontation. Extraocular movements are intact. Trigeminal sensation is intact and the muscles of mastication are normal. The face is symmetric. The palate elevates in the midline. Hearing intact. Voice is normal. Shoulder shrug is normal. The tongue has normal motion without fasciculations.   Coordination:    Normal finger to nose and heel to shin. Normal rapid alternating movements.   Gait:    Heel-toe and tandem gait are normal.   Motor Observation:    No asymmetry, no atrophy, and no involuntary movements noted. Tone:    Normal muscle tone.    Posture:    Posture is normal. normal erect    Strength:    Strength is 5/5 in the upper and lower limbs on the rigtht  but weaker on the left more proximally 4/5, strong distally     Sensation: intact to LT and pinprick bilaterally, symmetrical     Reflex Exam:  DTR's:    Deep tendon reflexes in the upper and lower extremities are normal bilaterally.   Toes:    The toes are downgoing bilaterally.   Clonus:    Clonus is absent.   Assessment/Plan:  13 year year old with a strange constellation of symptoms including lest-sided arm and leg numbness, paresthesias and pain in the setting of thyroiditis, also pain, paresthesias and burning in the left fingers, weakness bilaterally in the hands, paresthesias in the left foot.  MRI 2022 of the brain was unremarkable except for a small pineal cyst, should recheck given new concerning symptoms,  MRI of the cervical spine was normal, she canceled EMG nerve conduction study ordered 2022 for peripheral causes of her paresthesias in the hands will reorder. Not painful, no need for gabapentin or nerve  treatment.   Repeat MRI brain as above: MRI brain due to concerning symptoms of headaches, hearing ans vision changes, left-sided sensory changes, prior pineal cyst/tumor, worsening head pressure to look for space occupying mass, chiari or intracranial hypertension (pseudotumor), strokes, malignancies, vasculidities, demyelination(multiple sclerosis) or other and follow pineal findings from 2022  She has had extensive blood work, no blood work needed  EMG/NCS: upper extremities looking for CTS or other. Start with left arm and if negative do not need to do the right. May check left leg and ensure no peroneal nearopathy. Left arm and hand numbness and tingling anf left foot numbness.   May consider check blood vessels H&N next if continues and no etiology found   Orders Placed This Encounter  Procedures   MR BRAIN W WO CONTRAST   Hemoglobin A1c   NCV with EMG(electromyography)    Cc: Lois Huxley, PA,  Lois Huxley, [place]  Sarina Ill, MD  [facility] [address] [place], [place] [address]  Phone [phone] Fax [phone]  I spent over 45 minutes of face-to-face and non-face-to-face time with patient on the  1. Left-sided sensory deficit present   2. Vision changes   3. Pressure in head   4. Paresthesias in left hand   5. Numbness and tingling of foot   6. Pineal tumor   7. Subjective hearing change of both ears   8. Tinnitus of both ears   9. Left-sided weakness   10. Elevated glucose   11. Paresthesias    diagnosis.  This included previsit chart review, lab review, study review, order entry, electronic health record documentation, patient education on the different diagnostic and therapeutic options, counseling and coordination of care, risks and benefits of management, compliance, or risk factor reduction

## 2023-01-19 NOTE — Telephone Encounter (Signed)
[employer] federal NPR sent to GI [phone] 

## 2023-01-19 NOTE — Patient Instructions (Addendum)
MRI brain for numbness/tingling, left-sided weakness and sensory chnges, recheck pineal cysts Emg/ncs for numbness/tingling May consider check blood vessels H&N next if continues and no etiology found  Peripheral Neuropathy Peripheral neuropathy is a type of nerve damage. It affects nerves that carry signals between the spinal cord and the arms, legs, and the rest of the body (peripheral nerves). It does not affect nerves in the spinal cord or brain. In peripheral neuropathy, one nerve or a group of nerves may be damaged. Peripheral neuropathy is a broad category that includes many specific nerve disorders, like diabetic neuropathy, hereditary neuropathy, and carpal tunnel syndrome. What are the causes? This condition may be caused by: Certain diseases, such as: Diabetes. This is the most common cause of peripheral neuropathy. Autoimmune diseases, such as rheumatoid arthritis and systemic lupus erythematosus. Nerve diseases that are passed from parent to child (inherited). Kidney disease. Thyroid disease. Other causes may include: Nerve injury. Pressure or stress on a nerve that lasts a long time. Lack (deficiency) of B vitamins. This can result from alcoholism, poor diet, or a restricted diet. Infections. Some medicines, such as cancer medicines (chemotherapy). Poisonous (toxic) substances, such as lead and mercury. Too little blood flowing to the legs. In some cases, the cause of this condition is not known. What are the signs or symptoms? Symptoms of this condition depend on which of your nerves is damaged. Symptoms in the legs, hands, and arms can include: Loss of feeling (numbness) in the feet, hands, or both. Tingling in the feet, hands, or both. Burning pain. Very sensitive skin. Weakness. Not being able to move a part of the body (paralysis). Clumsiness or poor coordination. Muscle twitching. Loss of balance. Symptoms in other parts of the body can include: Not being able  to control your bladder. Feeling dizzy. Sexual problems. How is this diagnosed? Diagnosing and finding the cause of peripheral neuropathy can be difficult. Your health care provider will take your medical history and do a physical exam. A neurological exam will also be done. This involves checking things that are affected by your brain, spinal cord, and nerves (nervous system). For example, your health care provider will check your reflexes, how you move, and what you can feel. You may have other tests, such as: Blood tests. Electromyogram (EMG) and nerve conduction tests. These tests check nerve function and how well the nerves are controlling the muscles. Imaging tests, such as a CT scan or MRI, to rule out other causes of your symptoms. Removing a small piece of nerve to be examined in a lab (nerve biopsy). Removing and examining a small amount of the fluid that surrounds the brain and spinal cord (lumbar puncture). How is this treated? Treatment for this condition may involve: Treating the underlying cause of the neuropathy, such as diabetes, kidney disease, or vitamin deficiencies. Stopping medicines that can cause neuropathy, such as chemotherapy. Medicine to help relieve pain. Medicines may include: Prescription or over-the-counter pain medicine. Anti-seizure medicine. Antidepressants. Pain-relieving patches that are applied to painful areas of skin. Surgery to relieve pressure on a nerve or to destroy a nerve that is causing pain. Physical therapy to help improve movement and balance. Devices to help you move around (assistive devices). Follow these instructions at home: Medicines Take over-the-counter and prescription medicines only as told by your health care provider. Do not take any other medicines without first asking your health care provider. Ask your health care provider if the medicine prescribed to you requires you to avoid driving  or using machinery. Lifestyle  Do not  use any products that contain nicotine or tobacco. These products include cigarettes, chewing tobacco, and vaping devices, such as e-cigarettes. Smoking keeps blood from reaching damaged nerves. If you need help quitting, ask your health care provider. Avoid or limit alcohol. Too much alcohol can cause a vitamin B deficiency, and vitamin B is needed for healthy nerves. Eat a healthy diet. This includes: Eating foods that are high in fiber, such as beans, whole grains, and fresh fruits and vegetables. Limiting foods that are high in fat and processed sugars, such as fried or sweet foods. General instructions  If you have diabetes, work closely with your health care provider to keep your blood sugar under control. If you have numbness in your feet: Check every day for signs of injury or infection. Watch for redness, warmth, and swelling. Wear padded socks and comfortable shoes. These help protect your feet. Develop a good support system. Living with peripheral neuropathy can be stressful. Consider talking with a mental health specialist or joining a support group. Use assistive devices and attend physical therapy as told by your health care provider. This may include using a walker or a cane. Keep all follow-up visits. This is important. Where to find more information [employer] of Neurological Disorders: [employer] Contact a health care provider if: You have new signs or symptoms of peripheral neuropathy. You are struggling emotionally from dealing with peripheral neuropathy. Your pain is not well controlled. Get help right away if: You have an injury or infection that is not healing normally. You develop new weakness in an arm or leg. You have fallen or do so frequently. Summary Peripheral neuropathy is when the nerves in the arms or legs are damaged, resulting in numbness, weakness, or pain. There are many causes of peripheral neuropathy, including diabetes, pinched nerves,  vitamin deficiencies, autoimmune disease, and hereditary conditions. Diagnosing and finding the cause of peripheral neuropathy can be difficult. Your health care provider will take your medical history, do a physical exam, and do tests, including blood tests and nerve function tests. Treatment involves treating the underlying cause of the neuropathy and taking medicines to help control pain. Physical therapy and assistive devices may also help. This information is not intended to replace advice given to you by your health care provider. Make sure you discuss any questions you have with your health care provider. Document Revised: 08/13/2021 Document Reviewed: 08/13/2021 Elsevier Patient Education  2023 [place] is a test to check how well your muscles and nerves are working. This procedure includes the combined use of electromyogram (EMG) and nerve conduction study (NCS). EMG is used to evaluate muscles and the nerves that control those muscles. NCS, which is also called electroneurogram, measures how well your nerves conduct electricity. The procedures should be done together to check if your muscles and nerves are healthy. If the results of the tests are abnormal, this may indicate disease or injury, such as a neuromuscular disease or peripheral nerve damage. Tell a health care provider about: Any allergies you have. All medicines you are taking, including vitamins, herbs, eye drops, creams, and over-the-counter medicines. Any bleeding problems you have. Any surgeries you have had. Any medical conditions you have. What are the risks? Generally, this is a safe procedure. However, problems may occur, including: Bleeding or bruising. Infection where the electrodes were inserted. What happens before the test? Medicines Take all of your usually prescribed medications before this testing is  performed. Do not stop your blood thinners unless advised by  your prescribing physician. General instructions Your health care provider may ask you to warm the limb that will be checked with warm water, hot pack, or wrapping the limb in a blanket. Do not use lotions or creams on the same day that you will be having the procedure. What happens during the test? For EMG  Your health care provider will ask you to stay in a position so that the muscle being studied can be accessed. You will be sitting or lying down. You may be given a medicine to numb the area (local anesthetic) and the skin will be disinfected. A very thin needle that has an electrode will be inserted into your muscle, one muscle at a time. Typically, multiple muscles are evaluated during a single study. Another small electrode will be placed on your skin near the muscle. Your health care provider will ask you to continue to remain still. The electrodes will record the electrical activity of your muscles. You may see this on a monitor or hear it in the room. After your muscles have been studied at rest, your health care provider will ask you to contract or flex your muscles. The electrodes will record the electrical activity of your muscles. Your health care provider will remove the electrodes and the electrode needle when the procedure is finished. The procedure may vary among health care providers and hospitals. For NCS  An electrode that records your nerve activity (recording electrode) will be placed on your skin by the muscle that is being studied. An electrode that is used as a reference (reference electrode) will be placed near the recording electrode. A paste or gel will be applied to your skin between the recording electrode and the reference electrode. Your nerve will be stimulated with a mild shock. The speed of the nerves and strength of response is recorded by the electrodes. Your health care provider will remove the electrodes and the gel when the procedure is finished. The  procedure may vary among health care providers and hospitals. What can I expect after the test? It is up to you to get your test results. Ask your health care provider, or the department that is doing the test, when your results will be ready. Your health care provider may: Give you medicines for any pain. Monitor the insertion sites to make sure that bleeding stops. You should be able to drive yourself to and from the test. Discomfort can persist for a few hours after the test, but should be better the next day. Contact a health care provider if: You have swelling, redness, or drainage at any of the insertion sites. Summary Electromyoneurogram is a test to check how well your muscles and nerves are working. If the results of the tests are abnormal, this may indicate disease or injury. This is a safe procedure. However, problems may occur, such as bleeding and infection. Your health care provider will do two tests to complete this procedure. One checks your muscles (EMG) and another checks your nerves (NCS). It is up to you to get your test results. Ask your health care provider, or the department that is doing the test, when your results will be ready. This information is not intended to replace advice given to you by your health care provider. Make sure you discuss any questions you have with your health care provider. Document Revised: 08/21/2021 Document Reviewed: 07/21/2021 Elsevier Patient Education  [place].

## 2023-01-20 LAB — HEMOGLOBIN A1C
Est. average glucose Bld gHb Est-mCnc: 105 mg/dL
Hgb A1c MFr Bld: 5.3 % (ref 4.8–5.6)

## 2023-01-30 ENCOUNTER — Ambulatory Visit
Admission: RE | Admit: 2023-01-30 | Discharge: 2023-01-30 | Disposition: A | Discharge status: Home / Self Care | Payer: Federal, State, Local not specified - PPO | Source: Ambulatory Visit | Attending: Neurology | Admitting: Neurology

## 2023-01-30 DIAGNOSIS — R449 Unspecified symptoms and signs involving general sensations and perceptions: Secondary | ICD-10-CM

## 2023-01-30 DIAGNOSIS — H9193 Unspecified hearing loss, bilateral: Secondary | ICD-10-CM

## 2023-01-30 DIAGNOSIS — R519 Headache, unspecified: Secondary | ICD-10-CM

## 2023-01-30 DIAGNOSIS — H539 Unspecified visual disturbance: Secondary | ICD-10-CM | POA: Diagnosis not present

## 2023-01-30 DIAGNOSIS — H9313 Tinnitus, bilateral: Secondary | ICD-10-CM

## 2023-01-30 DIAGNOSIS — D497 Neoplasm of unspecified behavior of endocrine glands and other parts of nervous system: Secondary | ICD-10-CM

## 2023-01-30 DIAGNOSIS — R200 Anesthesia of skin: Secondary | ICD-10-CM | POA: Diagnosis not present

## 2023-01-30 DIAGNOSIS — R202 Paresthesia of skin: Secondary | ICD-10-CM

## 2023-01-30 MED ORDER — GADOPICLENOL 0.5 MMOL/ML IV SOLN
6.0000 mL | Freq: Once | INTRAVENOUS | Status: AC | PRN
  Administered 2023-01-30: 6 mL via INTRAVENOUS

## 2023-02-18 ENCOUNTER — Ambulatory Visit: Payer: Federal, State, Local not specified - PPO | Admitting: Neurology

## 2023-02-24 DIAGNOSIS — E0590 Thyrotoxicosis, unspecified without thyrotoxic crisis or storm: Secondary | ICD-10-CM | POA: Diagnosis not present

## 2023-03-05 ENCOUNTER — Ambulatory Visit: Payer: Federal, State, Local not specified - PPO | Admitting: Neurology

## 2023-03-05 VITALS — BP 118/73 | HR 62 | Ht 62.0 in | Wt 144.0 lb

## 2023-03-05 DIAGNOSIS — E538 Deficiency of other specified B group vitamins: Secondary | ICD-10-CM | POA: Diagnosis not present

## 2023-03-05 DIAGNOSIS — R200 Anesthesia of skin: Secondary | ICD-10-CM

## 2023-03-05 DIAGNOSIS — R202 Paresthesia of skin: Secondary | ICD-10-CM | POA: Insufficient documentation

## 2023-03-05 DIAGNOSIS — E531 Pyridoxine deficiency: Secondary | ICD-10-CM

## 2023-03-05 DIAGNOSIS — E519 Thiamine deficiency, unspecified: Secondary | ICD-10-CM

## 2023-03-05 NOTE — Progress Notes (Signed)
Full Name: Karina Mitchell Gender: Female MRN #: [id number] Date of Birth: Jul 01, 1976    Visit Date: 03/05/2023 09:30 Age: 47 Years Examining Physician: Dr. Sarina Ill Referring Physician: Dr. Sarina Ill Height: 5 feet 2 inch  History:  Patient here for emg/ncs of the left arm and leg. 67 year year old with a strange constellation of symptoms started in 2020, resolved, then recurred including: left-sided arm and leg numbness, paresthesias and pain. Pain, paresthesias and burning in the fingers, weakness the left hand, paresthesias in the left foot, sharp stabbing pains.  Right side asymptomatic. Denies neck or back pain or radicular symptoms.  Summary: EMG/NCS performed on the left upper and left lower extremities. All nerves and muscles (as indicated in the following tables) were within normal limits.       Conclusion: This is a normal study of the left upper and left lower extremities. No evidence for mononeuropathy, polyneuropathy, radiculopathy or neurogenic muscle disease.  ------------------------------- Sarina Ill, M.D.  [facility] [address] [place], [place] [address] Tel: [phone] Fax: [phone]  Verbal informed consent was obtained from the patient, patient was informed of potential risk of procedure, including bruising, bleeding, hematoma formation, infection, muscle weakness, muscle pain, numbness, among others.        [place]    Nerve / Sites Muscle Latency Ref. Amplitude Ref. Rel Amp Segments Distance Velocity Ref. Area    ms ms mV mV %  cm m/s m/s mVms  L Median - APB     Wrist APB 3.2 ?4.4 11.5 ?4.0 100 Wrist - APB 7   34.3     Upper arm APB 7.1  10.7  92.7 Upper arm - Wrist 23 59 ?49 34.4  L Ulnar - ADM     Wrist ADM 2.7 ?3.3 13.5 ?6.0 100 Wrist - ADM 7   33.7     B.Elbow ADM 4.6  12.6  93.3 B.Elbow - Wrist 12 64 ?49 31.2     A.Elbow ADM 6.5  11.8  93.6 A.Elbow - B.Elbow 13 69 ?49 26.2  L Peroneal - EDB      Ankle EDB 5.2 ?6.5 3.3 ?2.0 100 Ankle - EDB 9   11.9     Fib head EDB 9.9  4.1  123 Fib head - Ankle 21.5 46 ?44 15.2     Pop fossa EDB 12.1  4.6  113 Pop fossa - Fib head 11 48 ?44 17.7         Pop fossa - Ankle      L Tibial - AH     Ankle AH 4.0 ?5.8 21.2 ?4.0 100 Ankle - AH 9   45.8     Pop fossa AH 11.9  19.5  92.1 Pop fossa - Ankle 39 50 ?41 49.7             SNC    Nerve / Sites Rec. Site Peak Lat Ref.  Amp Ref. Segments Distance Peak Diff Ref.    ms ms V V  cm ms ms  L Sural - Ankle (Calf)     Calf Ankle 2.4 ?4.4 16 ?6 Calf - Ankle 14    L Superficial peroneal - Ankle     Lat leg Ankle 2.7 ?4.4 8 ?6 Lat leg - Ankle 14    L Median, Ulnar - Transcarpal comparison     Median Palm Wrist 2.0 ?2.2 52 ?35 Median Palm - Wrist 8  Ulnar Palm Wrist 1.8 ?2.2 20 ?12 Ulnar Palm - Wrist 8          Median Palm - Ulnar Palm  0.2 ?0.4  L Median - Orthodromic (Dig II, Mid palm)     Dig II Wrist 2.7 ?3.4 11 ?10 Dig II - Wrist 13    L Ulnar - Orthodromic, (Dig V, Mid palm)     Dig V Wrist 2.6 ?3.1 7 ?5 Dig V - Wrist 77                 F  Wave    Nerve F Lat Ref.   ms ms  L Ulnar - ADM 24.8 ?32.0  L Tibial - AH 47.0 ?56.0         H Reflex    Nerve H Lat Lat Hmax   ms ms   Left Right Ref. Left Right Ref.  Tibial - Soleus 29.9 29.9 ?35.0 32.7 29.9 ?35.0         EMG Summary Table    Spontaneous MUAP Recruitment  Muscle IA Fib PSW Fasc Other Amp Dur. Poly Pattern  L. Cervical paraspinals (low) Normal None None None _______ Normal Normal Normal Normal  L. Deltoid Normal None None None _______ Normal Normal Normal Normal  L. Biceps brachii Normal None None None _______ Normal Normal Normal Normal  L. Triceps brachii Normal None None None _______ Normal Normal Normal Normal  L. Pronator teres Normal None None None _______ Normal Normal Normal Normal  L. First dorsal interosseous Normal None None None _______ Normal Normal Normal Normal  L. Extensor indicis proprius Normal None None  None _______ Normal Normal Normal Normal  L. Opponens pollicis Normal None None None _______ Normal Normal Normal Normal  L. Vastus medialis Normal None None None _______ Normal Normal Normal Normal  L. Tibialis anterior Normal None None None _______ Normal Normal Normal Normal  L. Gastrocnemius (Medial head) Normal None None None _______ Normal Normal Normal Normal  L. Extensor hallucis longus Normal None None None _______ Normal Normal Normal Normal  L. Abductor hallucis Normal None None None _______ Normal Normal Normal Normal

## 2023-03-05 NOTE — Progress Notes (Signed)
History:  Patient here for emg/ncs of the left arm and leg. 29 year year old with a strange constellation of symptoms started in 2020, resolved, then recurred including: left-sided arm and leg numbness, paresthesias and pain. Pain, paresthesias and burning in the fingers, weakness the left hand, paresthesias in the left foot, sharp stabbing pains.  Right side asymptomatic. Denies neck or back pain or radicular symptoms.   Discussed with patient after testing, emg/ncs normal. No neurologic central or peripheral cause seen for her symptoms. She had a rheumatologic workup in the past per patient which was negative. I will check some additional blood work today.  Discussed the causes of neuropathy, the most common being diabetes which patient does not report having.  There are multiple causes eluding metabolic, toxic, infectious and endocrine disorders, small vessel disease, autoimmune diseases, and others. MRI of the brain was unremarkable x 2, MRI of the cervical spine was normal.  Reviewed images and agree:   MRI brain 01/2023: CLINICAL HISTORY: 47 year old woman with headache and visual changes and numbness.  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 COMPARISON FILMS: 10/30/2021    IMPRESSION: This is a normal age-appropriate MRI of the brain with and without contrast.  Incidental note is made of: A couple punctate T2/FLAIR hyperintense foci in the subcortical white matter of the left frontal lobe.  This is a nonspecific finding that is normal for age and likely due to either very minimal chronic microvascular ischemic change or sequela of migraine headache.  It is unchanged compared to the 2022 MRI. 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  This is stable compared to the 10/30/2021 MRI and is unlikely to be clinically significant No acute findings.  Normal enhancement pattern.   MRI cervical spine: STUDY DATE: 01/07/22   IMPRESSION:    Normal MRI cervical spine (with and without).      Orders Placed This Encounter  Procedures   B12 and Folate Panel   Methylmalonic acid, serum   Vitamin B1   Vitamin B6   Multiple Myeloma Panel (SPEP&IFE w/QIG)    I spent over 20 minutes of face-to-face and non-face-to-face time with patient on the  1. Paresthesias in left hand   2. Numbness and tingling of foot   3. screen for B12 deficiency   4. screen for Vitamin B1 deficiency   5. screen for Vitamin B6 deficiency    diagnosis.  This included previsit chart review, lab review, study review, order entry, electronic health record documentation, patient education on the different diagnostic and therapeutic options, counseling and coordination of care, risks and benefits of management, compliance, or risk factor reduction. This does not include time spent on emg/ncs.

## 2023-03-05 NOTE — Patient Instructions (Addendum)
Peripheral Neuropathy Peripheral neuropathy is a type of nerve damage. It affects nerves that carry signals between the spinal cord and the arms, legs, and the rest of the body (peripheral nerves). It does not affect nerves in the spinal cord or brain. In peripheral neuropathy, one nerve or a group of nerves may be damaged. Peripheral neuropathy is a broad category that includes many specific nerve disorders, like diabetic neuropathy, hereditary neuropathy, and carpal tunnel syndrome. What are the causes? This condition may be caused by: Certain diseases, such as: Diabetes. This is the most common cause of peripheral neuropathy. Autoimmune diseases, such as rheumatoid arthritis and systemic lupus erythematosus. Nerve diseases that are passed from parent to child (inherited). Kidney disease. Thyroid disease. Other causes may include: Nerve injury. Pressure or stress on a nerve that lasts a long time. Lack (deficiency) of B vitamins. This can result from alcoholism, poor diet, or a restricted diet. Infections. Some medicines, such as cancer medicines (chemotherapy). Poisonous (toxic) substances, such as lead and mercury. Too little blood flowing to the legs. In some cases, the cause of this condition is not known. What are the signs or symptoms? Symptoms of this condition depend on which of your nerves is damaged. Symptoms in the legs, hands, and arms can include: Loss of feeling (numbness) in the feet, hands, or both. Tingling in the feet, hands, or both. Burning pain. Very sensitive skin. Weakness. Not being able to move a part of the body (paralysis). Clumsiness or poor coordination. Muscle twitching. Loss of balance. Symptoms in other parts of the body can include: Not being able to control your bladder. Feeling dizzy. Sexual problems. How is this diagnosed? Diagnosing and finding the cause of peripheral neuropathy can be difficult. Your health care provider will take your  medical history and do a physical exam. A neurological exam will also be done. This involves checking things that are affected by your brain, spinal cord, and nerves (nervous system). For example, your health care provider will check your reflexes, how you move, and what you can feel. You may have other tests, such as: Blood tests. Electromyogram (EMG) and nerve conduction tests. These tests check nerve function and how well the nerves are controlling the muscles. Imaging tests, such as a CT scan or MRI, to rule out other causes of your symptoms. Removing a small piece of nerve to be examined in a lab (nerve biopsy). Removing and examining a small amount of the fluid that surrounds the brain and spinal cord (lumbar puncture). How is this treated? Treatment for this condition may involve: Treating the underlying cause of the neuropathy, such as diabetes, kidney disease, or vitamin deficiencies. Stopping medicines that can cause neuropathy, such as chemotherapy. Medicine to help relieve pain. Medicines may include: Prescription or over-the-counter pain medicine. Anti-seizure medicine. Antidepressants. Pain-relieving patches that are applied to painful areas of skin. Surgery to relieve pressure on a nerve or to destroy a nerve that is causing pain. Physical therapy to help improve movement and balance. Devices to help you move around (assistive devices). Follow these instructions at home: Medicines Take over-the-counter and prescription medicines only as told by your health care provider. Do not take any other medicines without first asking your health care provider. Ask your health care provider if the medicine prescribed to you requires you to avoid driving or using machinery. Lifestyle  Do not use any products that contain nicotine or tobacco. These products include cigarettes, chewing tobacco, and vaping devices, such as e-cigarettes. Smoking keeps   blood from reaching damaged nerves. If you  need help quitting, ask your health care provider. Avoid or limit alcohol. Too much alcohol can cause a vitamin B deficiency, and vitamin B is needed for healthy nerves. Eat a healthy diet. This includes: Eating foods that are high in fiber, such as beans, whole grains, and fresh fruits and vegetables. Limiting foods that are high in fat and processed sugars, such as fried or sweet foods. General instructions  If you have diabetes, work closely with your health care provider to keep your blood sugar under control. If you have numbness in your feet: Check every day for signs of injury or infection. Watch for redness, warmth, and swelling. Wear padded socks and comfortable shoes. These help protect your feet. Develop a good support system. Living with peripheral neuropathy can be stressful. Consider talking with a mental health specialist or joining a support group. Use assistive devices and attend physical therapy as told by your health care provider. This may include using a walker or a cane. Keep all follow-up visits. This is important. Where to find more information National Institute of Neurological Disorders: www.ninds.nih.gov Contact a health care provider if: You have new signs or symptoms of peripheral neuropathy. You are struggling emotionally from dealing with peripheral neuropathy. Your pain is not well controlled. Get help right away if: You have an injury or infection that is not healing normally. You develop new weakness in an arm or leg. You have fallen or do so frequently. Summary Peripheral neuropathy is when the nerves in the arms or legs are damaged, resulting in numbness, weakness, or pain. There are many causes of peripheral neuropathy, including diabetes, pinched nerves, vitamin deficiencies, autoimmune disease, and hereditary conditions. Diagnosing and finding the cause of peripheral neuropathy can be difficult. Your health care provider will take your medical  history, do a physical exam, and do tests, including blood tests and nerve function tests. Treatment involves treating the underlying cause of the neuropathy and taking medicines to help control pain. Physical therapy and assistive devices may also help. This information is not intended to replace advice given to you by your health care provider. Make sure you discuss any questions you have with your health care provider. Document Revised: 08/13/2021 Document Reviewed: 08/13/2021 Elsevier Patient Education  2023 [employer]  

## 2023-03-05 NOTE — Procedures (Signed)
Full Name: Karina Mitchell Gender: Female MRN #: [id number] Date of Birth: [birth year]    Visit Date: 03/05/2023 09:30 Age: 47 Years Examining Physician: Dr. Sarina Ill Referring Physician: Dr. Sarina Ill Height: 5 feet 2 inch  History:  Patient here for emg/ncs of the left arm and leg. 28 year year old with a strange constellation of symptoms started in 2020, resolved, then recurred including: left-sided arm and leg numbness, paresthesias and pain. Pain, paresthesias and burning in the fingers, weakness the left hand, paresthesias in the left foot, sharp stabbing pains.  Right side asymptomatic. Denies neck or back pain or radicular symptoms.  Summary: EMG/NCS performed on the left upper and left lower extremities. All nerves and muscles (as indicated in the following tables) were within normal limits.       Conclusion: This is a normal study of the left upper and left lower extremities. No evidence for mononeuropathy, polyneuropathy, radiculopathy or neurogenic muscle disease.  ------------------------------- Sarina Ill, M.D.  [facility] [address] [place], [place] [address] Tel: [phone] Fax: [phone]  Verbal informed consent was obtained from the patient, patient was informed of potential risk of procedure, including bruising, bleeding, hematoma formation, infection, muscle weakness, muscle pain, numbness, among others.        [place]    Nerve / Sites Muscle Latency Ref. Amplitude Ref. Rel Amp Segments Distance Velocity Ref. Area    ms ms mV mV %  cm m/s m/s mVms  L Median - APB     Wrist APB 3.2 ?4.4 11.5 ?4.0 100 Wrist - APB 7   34.3     Upper arm APB 7.1  10.7  92.7 Upper arm - Wrist 23 59 ?49 34.4  L Ulnar - ADM     Wrist ADM 2.7 ?3.3 13.5 ?6.0 100 Wrist - ADM 7   33.7     B.Elbow ADM 4.6  12.6  93.3 B.Elbow - Wrist 12 64 ?49 31.2     A.Elbow ADM 6.5  11.8  93.6 A.Elbow - B.Elbow 13 69 ?49 26.2  L Peroneal - EDB      Ankle EDB 5.2 ?6.5 3.3 ?2.0 100 Ankle - EDB 9   11.9     Fib head EDB 9.9  4.1  123 Fib head - Ankle 21.5 46 ?44 15.2     Pop fossa EDB 12.1  4.6  113 Pop fossa - Fib head 11 48 ?44 17.7         Pop fossa - Ankle      L Tibial - AH     Ankle AH 4.0 ?5.8 21.2 ?4.0 100 Ankle - AH 9   45.8     Pop fossa AH 11.9  19.5  92.1 Pop fossa - Ankle 39 50 ?41 49.7             SNC    Nerve / Sites Rec. Site Peak Lat Ref.  Amp Ref. Segments Distance Peak Diff Ref.    ms ms V V  cm ms ms  L Sural - Ankle (Calf)     Calf Ankle 2.4 ?4.4 16 ?6 Calf - Ankle 14    L Superficial peroneal - Ankle     Lat leg Ankle 2.7 ?4.4 8 ?6 Lat leg - Ankle 14    L Median, Ulnar - Transcarpal comparison     Median Palm Wrist 2.0 ?2.2 52 ?35 Median Palm - Wrist 8  Ulnar Palm Wrist 1.8 ?2.2 20 ?12 Ulnar Palm - Wrist 8          Median Palm - Ulnar Palm  0.2 ?0.4  L Median - Orthodromic (Dig II, Mid palm)     Dig II Wrist 2.7 ?3.4 11 ?10 Dig II - Wrist 13    L Ulnar - Orthodromic, (Dig V, Mid palm)     Dig V Wrist 2.6 ?3.1 7 ?5 Dig V - Wrist 49                 F  Wave    Nerve F Lat Ref.   ms ms  L Ulnar - ADM 24.8 ?32.0  L Tibial - AH 47.0 ?56.0         H Reflex    Nerve H Lat Lat Hmax   ms ms   Left Right Ref. Left Right Ref.  Tibial - Soleus 29.9 29.9 ?35.0 32.7 29.9 ?35.0         EMG Summary Table    Spontaneous MUAP Recruitment  Muscle IA Fib PSW Fasc Other Amp Dur. Poly Pattern  L. Cervical paraspinals (low) Normal None None None _______ Normal Normal Normal Normal  L. Deltoid Normal None None None _______ Normal Normal Normal Normal  L. Biceps brachii Normal None None None _______ Normal Normal Normal Normal  L. Triceps brachii Normal None None None _______ Normal Normal Normal Normal  L. Pronator teres Normal None None None _______ Normal Normal Normal Normal  L. First dorsal interosseous Normal None None None _______ Normal Normal Normal Normal  L. Extensor indicis proprius Normal None None  None _______ Normal Normal Normal Normal  L. Opponens pollicis Normal None None None _______ Normal Normal Normal Normal  L. Vastus medialis Normal None None None _______ Normal Normal Normal Normal  L. Tibialis anterior Normal None None None _______ Normal Normal Normal Normal  L. Gastrocnemius (Medial head) Normal None None None _______ Normal Normal Normal Normal  L. Extensor hallucis longus Normal None None None _______ Normal Normal Normal Normal  L. Abductor hallucis Normal None None None _______ Normal Normal Normal Normal

## 2023-03-11 LAB — B12 AND FOLATE PANEL
Folate: >20 ng/mL (ref >3.0)
Vitamin B-12: 605 pg/mL (ref 232–1245)

## 2023-03-11 LAB — MULTIPLE MYELOMA PANEL, SERUM
Albumin SerPl Elph-Mcnc: 4.5 g/dL — ABNORMAL HIGH (ref 2.9–4.4)
Albumin/Glob SerPl: 1.6 (ref 0.7–1.7)
Alpha 1: 0.2 g/dL (ref 0.0–0.4)
Alpha2 Glob SerPl Elph-Mcnc: 0.6 g/dL (ref 0.4–1.0)
B-Globulin SerPl Elph-Mcnc: 0.9 g/dL (ref 0.7–1.3)
Gamma Glob SerPl Elph-Mcnc: 1.3 g/dL (ref 0.4–1.8)
Globulin, Total: 2.9 g/dL (ref 2.2–3.9)
IgA/Immunoglobulin A, Serum: 148 mg/dL (ref 87–352)
IgG (Immunoglobin G), Serum: 1071 mg/dL (ref 586–1602)
IgM (Immunoglobulin M), Srm: 244 mg/dL — ABNORMAL HIGH (ref 26–217)
Total Protein: 7.4 g/dL (ref 6.0–8.5)

## 2023-03-11 LAB — METHYLMALONIC ACID, SERUM: Methylmalonic Acid: 140 nmol/L (ref 0–378)

## 2023-03-11 LAB — VITAMIN B1: Thiamine: 88.8 nmol/L (ref 66.5–200.0)

## 2023-03-11 LAB — VITAMIN B6: Vitamin B6: 19.8 ug/L (ref 3.4–65.2)

## 2023-03-12 DIAGNOSIS — E559 Vitamin D deficiency, unspecified: Secondary | ICD-10-CM | POA: Diagnosis not present

## 2023-03-12 DIAGNOSIS — E786 Lipoprotein deficiency: Secondary | ICD-10-CM | POA: Diagnosis not present

## 2023-03-12 DIAGNOSIS — D500 Iron deficiency anemia secondary to blood loss (chronic): Secondary | ICD-10-CM | POA: Diagnosis not present

## 2023-03-12 DIAGNOSIS — E031 Congenital hypothyroidism without goiter: Secondary | ICD-10-CM | POA: Diagnosis not present

## 2023-03-18 ENCOUNTER — Encounter: Payer: Self-pay | Admitting: Neurology

## 2023-04-06 DIAGNOSIS — E031 Congenital hypothyroidism without goiter: Secondary | ICD-10-CM | POA: Diagnosis not present

## 2023-05-01 DIAGNOSIS — E041 Nontoxic single thyroid nodule: Secondary | ICD-10-CM | POA: Diagnosis not present

## 2023-05-01 DIAGNOSIS — Z0389 Encounter for observation for other suspected diseases and conditions ruled out: Secondary | ICD-10-CM | POA: Diagnosis not present

## 2023-05-13 ENCOUNTER — Ambulatory Visit: Payer: Federal, State, Local not specified - PPO | Attending: Cardiology | Admitting: Cardiology

## 2023-05-13 ENCOUNTER — Ambulatory Visit: Payer: Federal, State, Local not specified - PPO | Attending: Cardiology

## 2023-05-13 VITALS — BP 106/62 | HR 75 | Ht 62.0 in | Wt 143.6 lb

## 2023-05-13 DIAGNOSIS — R002 Palpitations: Secondary | ICD-10-CM | POA: Diagnosis not present

## 2023-05-13 DIAGNOSIS — E069 Thyroiditis, unspecified: Secondary | ICD-10-CM

## 2023-05-13 MED ORDER — METOPROLOL SUCCINATE ER 25 MG PO TB24: 12.5000 mg | ORAL_TABLET | Freq: Every day | ORAL | 6 refills | Status: AC

## 2023-05-13 NOTE — Progress Notes (Unsigned)
Enrolled patient for a 7 day [employer] XT monitor to be mailed to patients home.  

## 2023-05-13 NOTE — Progress Notes (Signed)
Primary Care Provider: Wilfrid Lund, PA [facility] Cardiologist: Karina Lemma, MD Electrophysiologist: None  Clinic Note: Chief Complaint  Patient presents with   New Patient (Initial Visit)    Complaints of rapid palpitations    ===================================  ASSESSMENT/PLAN   Problem List Items Addressed This Visit     Thyroiditis (Chronic)    Thyroiditis seems to have burned-out although her thyroid dosing still seems to be erratic.  She is down to 75 mcg of Synthroid.  However last TSH level was still pretty low.  May be that she needs less aggressive supplementation.      Relevant Medications   metoprolol succinate (TOPROL XL) 25 MG 24 hr tablet   Other Relevant Orders   EKG 12-Lead (Completed)   LONG TERM MONITOR (3-14 DAYS)   Rapid palpitations - Primary    Palpitations are probably related to her thyroid levels like to see what they are.  Plan: 7 day Zio patch monitor Restart Toprol at 25 mg daily but start once the monitor is turned in.       Relevant Orders   EKG 12-Lead (Completed)   LONG TERM MONITOR (3-14 DAYS)    ===================================  HPI:    Karina Mitchell is a 47 y.o. female with a PMH notable for recent diagnosis of Hyperthyroidism who presents today for Recurrent Palpitations at the request of Wilfrid Lund, [place].  Karina Mitchell was last seen by Dr. Dulce Sellar in February 2021 for follow-up palpitations.  Was thought to be related to OTC pruritic drugs.  Did not consider using beta-blocker.  She was diagnosed with hyperthyroidism (subacute thyroiditis originally just simply treated with beta-blocker but no cardiac meds.  Recent Hospitalizations:  [facility] 11/13/2022: Noted significant increase in heart rate up to 160s while laying down.  Recommended close follow-up with endocrinologist.  Was felt to be sinus tachycardia.  Was told to increase to Toprol XL 100 mg daily.  Reviewed  CV studies:    The  following studies were reviewed today: (if available, images/films reviewed: From Epic Chart or Care Everywhere) Zio Patch Monitor January 2021: No pauses.  Rare PVCs with 1 4 beat run of PVCs.  Rare PACs with no atrial runs or A-fib/flutter.  1 3 beat run of PACs.  Symptoms not associated with any of the abnormal findings.  Interval History:   Karina Mitchell presents here today for evaluation of recurrent rapid heart rate spells. She was diagnosed with subacute thyroiditis in October 2023.  Was initially started on propranolol but was then switched to metoprolol tartrate 25 mg twice daily, was not treated with any thyroid blocker such as methimazole.  Eventually, she was weaned off of the beta-blocker after going Toprol 100 mg daily once she was started on Synthroid.  However it took a little while to get her Synthroid dose is stabilized when she was initially on 100 mcg then because of palpitations reduced to 88 and then always down to 75.  Finally at that level, she was been stable.  Prior to the onset of thyroiditis episode, she was very active and is now try to get back into her previous level of asked exercise.  She was hoping that once her thyroid levels stabilize, she would have less of the palpitation issues.  She said that about a month ago she was getting an appointment and she started feeling really short of breath and lightheaded but her heart rate going really fast.  That is when  she made the appointment.  Since then she has had a couple more episodes like this past Saturday she felt somewhat lightheaded dizzy as though her heart was racing.  She says that now it happens maybe once a day but could skip a day.  She feels lightheaded dizzy but is not any syncope or near syncope.  She is having issues with some paresthesias, but denies any stroke type symptoms. No chest pain or pressure exertion.  No arthritic symptoms of PND, orthopnea edema.   REVIEWED OF SYSTEMS   Review of Systems   Constitutional:  Negative for malaise/fatigue (Only when she has these fast heart rate spells.) and weight loss.  HENT:  Negative for congestion.   Respiratory:  Negative for cough and shortness of breath.   Gastrointestinal:  Negative for blood in stool and melena.  Genitourinary:  Negative for hematuria.  Musculoskeletal:  Negative for joint pain and myalgias.  Neurological:  Positive for tingling (Has been having left-sided paresthesias). Negative for dizziness and focal weakness.  Psychiatric/Behavioral:  Negative for depression and memory loss. The patient is nervous/anxious. The patient does not have insomnia.     I have reviewed and (if needed) personally updated the patient's problem list, medications, allergies, past medical and surgical history, social and family history.   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  related to pregnancy/childbirth ; only was on lovenox and ASA temporarily to reduce risk of clot formation   Hypothyroidism    Following thyroiditis.   Subacute thyroiditis 09/2022   happened within a couple days after flu shot per pt report   Vitamin D deficiency     PAST SURGICAL HISTORY   Past Surgical History:  Procedure Laterality Date   CESAREAN SECTION     x2    CHOLECYSTECTOMY N/A 10/01/2017   Procedure: LAPAROSCOPIC CHOLECYSTECTOMY WITH INTRAOPERATIVE CHOLANGIOGRAM;  Surgeon: Griselda Miner, MD;  Location: [facility];  Service: General;  Laterality: N/A;   LAPAROSCOPIC ENDOMETRIOSIS FULGURATION  2007   TUBAL LIGATION      Immunization History  Administered Date(s) Administered   Tdap 07/28/2012    MEDICATIONS/ALLERGIES   Current Meds  Medication Sig   Cholecalciferol (VITAMIN D) 50 MCG (2000 UT) CAPS Take 1 capsule by mouth daily.   fexofenadine (ALLEGRA) 180 MG tablet Take 180 mg by mouth  daily as needed for allergies or rhinitis.   levothyroxine (SYNTHROID) 75 MCG tablet Take 100 mcg by mouth daily before breakfast.   metoprolol succinate (TOPROL XL) 25 MG 24 hr tablet Take 0.5 tablets (12.5 mg total) by mouth daily.   Multiple Vitamin (MULTIVITAMIN WITH MINERALS) TABS tablet Take 1 tablet by mouth daily.   naproxen (NAPROSYN) 500 MG tablet Take 500 mg by mouth 2 (two) times daily as needed for mild pain.    Tetrahydrozoline-Zn Sulfate (EYE DROPS ALLERGY RELIEF OP) Apply 1 drop to eye daily.    Allergies  Allergen Reactions   Contrast Media [Iodinated Contrast Media] Other (See Comments)    Happened in 2009, all patient recalls is "drop in heart rate", "feeling like I was out"   Other     NUTS  Possibly Raspberries   Shellfish Allergy Swelling    Throat     SOCIAL HISTORY/FAMILY HISTORY   Reviewed in Epic:  Pertinent findings:  Social History  Tobacco Use   Smoking status: Never   Smokeless tobacco: Never  Vaping Use   Vaping Use: Never used  Substance Use Topics   Alcohol use: No   Drug use: No   Social History   Social History Narrative   Right handed   Caffeine: 1 cup coffee/day, not much soda    OBJCTIVE -PE, EKG, labs   Wt Readings from Last 3 Encounters:  05/13/23 143 lb 9.6 oz (65.1 kg)  03/05/23 144 lb (65.3 kg)  01/19/23 144 lb (65.3 kg)    Physical Exam: BP 106/62   Pulse 75   Ht 5\' 2"  (1.575 m)   Wt 143 lb 9.6 oz (65.1 kg)   SpO2 98%   BMI 26.26 kg/m  Physical Exam Vitals reviewed.  Constitutional:      General: She is not in acute distress.    Appearance: Normal appearance. She is normal weight. She is not ill-appearing or toxic-appearing.  HENT:     Head: Normocephalic and atraumatic.  Neck:     Vascular: No carotid bruit or JVD.  Cardiovascular:     Rate and Rhythm: Normal rate and regular rhythm. No extrasystoles are present.    Chest Wall: PMI is not displaced.     Pulses: Normal pulses.     Heart sounds: Normal  heart sounds, S1 normal and S2 normal. No murmur heard.    No friction rub. No gallop.  Pulmonary:     Effort: Pulmonary effort is normal. No respiratory distress.     Breath sounds: Normal breath sounds. No wheezing, rhonchi or rales.  Abdominal:     General: Abdomen is flat. Bowel sounds are normal. There is no distension.     Palpations: Abdomen is soft. There is no mass.     Tenderness: There is no abdominal tenderness. There is no guarding or rebound.  Musculoskeletal:        General: No swelling. Normal range of motion.     Cervical back: Normal range of motion and neck supple.  Skin:    General: Skin is warm.     Coloration: Skin is not jaundiced.  Neurological:     General: No focal deficit present.     Mental Status: She is alert and oriented to person, place, and time.     Gait: Gait normal.  Psychiatric:        Mood and Affect: Mood normal.        Behavior: Behavior normal.        Thought Content: Thought content normal.        Judgment: Judgment normal.     Comments: Somewhat anxious.      Adult ECG Report  Rate: 75 ;  Rhythm: normal sinus rhythm and normal axis, intervals and durations. ;   Narrative Interpretation: Normal  Recent Labs: Reviewed 01/19/2023: A1c 5.3. 03/12/2023: BUN 16, Cr 0.78, K4.0, Glu 91, Hgb 12.4; TC 167, TG 59, HDL 48, LDL 107. 04/06/2023: TSH 0.11 No results found for: "CHOL", "HDL", "LDLCALC", "LDLDIRECT", "TRIG", "CHOLHDL" No results found for: "CREATININE", "BUN", "NA", "K", "CL", "CO2"   Lab Results  Component Value Date   HGBA1C 5.3 01/19/2023   No results found for: "TSH"  ================================================== I spent a total of 18 minutes with the patient spent in direct patient consultation.  Additional time spent with chart review  / charting (studies, outside notes, etc): 23 min Total Time: 41 min  Current medicines are reviewed at length with the patient today.  (+/-  concerns) N/A  Notice: This dictation was  prepared with Dragon dictation along with smart phrase technology. Any transcriptional errors that result from this process are unintentional and may not be corrected upon review.  Studies Ordered:   Orders Placed This Encounter  Procedures   LONG TERM MONITOR (3-14 DAYS)   EKG 12-Lead   Meds ordered this encounter  Medications   metoprolol succinate (TOPROL XL) 25 MG 24 hr tablet    Sig: Take 0.5 tablets (12.5 mg total) by mouth daily.    Dispense:  15 tablet    Refill:  6    Patient Instructions / Medication Changes & Studies & Tests Ordered   Patient Instructions  Medication Instructions:    Metoprolol succinate ( Toprol XL ) 12.5 mg  daily -start after you complete Zio Monitor, and if symptoms of palpations persist after labs  *If you need a refill on your cardiac medications before your next appointment, please call your pharmacy*   Lab Work:  Not needed   Testing/Procedures: Will be mailed to your home in 3 to 7 days Your physician has recommended that you wear a holter monitor 7 days ZIO. Holter monitors are medical devices that record the heart's electrical activity. Doctors most often use these monitors to diagnose arrhythmias. Arrhythmias are problems with the speed or rhythm of the heartbeat. The monitor is a small, portable device. You can wear one while you do your normal daily activities. This is usually used to diagnose what is causing palpitations/syncope (passing out).    Follow-Up: At [facility], you and your health needs are our priority.  As part of our continuing mission to provide you with exceptional heart care, we have created designated Provider Care Teams.  These Care Teams include your primary Cardiologist (physician) and Advanced Practice Providers (APPs -  Physician Assistants and Nurse Practitioners) who all work together to provide you with the care you need, when you need it.     Your next appointment:    1 to 2 month(s)  The format  for your next appointment:   Virtual Visit   Provider:   Bryan Lemma, MD    Other Instructions  Karina Mitchell- Long Term Monitor Instructions    Karina Lex, MD, MS Karina Mitchell, M.D., M.S. Interventional Cardiologist  [facility]  Pager # [phone] Phone # [phone] [address] [place], [place] [address]   Thank you for choosing [facility] [place]!!

## 2023-05-13 NOTE — Patient Instructions (Signed)
Medication Instructions:    Metoprolol succinate ( Toprol XL ) 12.5 mg  daily -start after you complete Zio Monitor, and if symptoms of palpations persist after labs  *If you need a refill on your cardiac medications before your next appointment, please call your pharmacy*   Lab Work:  Not needed   Testing/Procedures: Will be mailed to your home in 3 to 7 days Your physician has recommended that you wear a holter monitor 7 days ZIO. Holter monitors are medical devices that record the heart's electrical activity. Doctors most often use these monitors to diagnose arrhythmias. Arrhythmias are problems with the speed or rhythm of the heartbeat. The monitor is a small, portable device. You can wear one while you do your normal daily activities. This is usually used to diagnose what is causing palpitations/syncope (passing out).    Follow-Up: At [facility], you and your health needs are our priority.  As part of our continuing mission to provide you with exceptional heart care, we have created designated Provider Care Teams.  These Care Teams include your primary Cardiologist (physician) and Advanced Practice Providers (APPs -  Physician Assistants and Nurse Practitioners) who all work together to provide you with the care you need, when you need it.     Your next appointment:    1 to 2 month(s)  The format for your next appointment:   Virtual Visit   Provider:   Bryan Lemma, MD    Other Instructions  ZIO XT- Long Term Monitor Instructions  Your physician has requested you wear a ZIO patch monitor for 7 days.  This is a single patch monitor. [employer] supplies one patch monitor per enrollment. Additional stickers are not available. Please do not apply patch if you will be having a Nuclear Stress Test,  Echocardiogram, Cardiac CT, MRI, or Chest Xray during the period you would be wearing the  monitor. The patch cannot be worn during these tests. You cannot remove and re-apply the   ZIO XT patch monitor.  Your ZIO patch monitor will be mailed 3 day USPS to your address on file. It may take 3-5 days  to receive your monitor after you have been enrolled.  Once you have received your monitor, please review the enclosed instructions. Your monitor  has already been registered assigning a specific monitor serial # to you.  Billing and Patient Assistance Program Information  We have supplied [employer] with any of your insurance information on file for billing purposes. [employer] offers a sliding scale Patient Assistance Program for patients that do not have  insurance, or whose insurance does not completely cover the cost of the ZIO monitor.  You must apply for the Patient Assistance Program to qualify for this discounted rate.  To apply, please call [employer] at [phone], select option 4, select option 2, ask to apply for  Patient Assistance Program. Meredeth Ide will ask your household income, and how many people  are in your household. They will quote your out-of-pocket cost based on that information.  [employer] will also be able to set up a 19-month, interest-free payment plan if needed.  Applying the monitor   Shave hair from upper left chest.  Hold abrader disc by orange tab. Rub abrader in 40 strokes over the upper left chest as  indicated in your monitor instructions.  Clean area with 4 enclosed alcohol pads. Let dry.  Apply patch as indicated in monitor instructions. Patch will be placed under collarbone on left  side of chest with  arrow pointing upward.  Rub patch adhesive wings for 2 minutes. Remove white label marked "1". Remove the white  label marked "2". Rub patch adhesive wings for 2 additional minutes.  While looking in a mirror, press and release button in center of patch. A small green light will  flash 3-4 times. This will be your only indicator that the monitor has been turned on.  Do not shower for the first 24 hours. You may shower after the first 24 hours.   Press the button if you feel a symptom. You will hear a small click. Record Date, Time and  Symptom in the Patient Logbook.  When you are ready to remove the patch, follow instructions on the last 2 pages of Patient  Logbook. Stick patch monitor onto the last page of Patient Logbook.  Place Patient Logbook in the blue and white box. Use locking tab on box and tape box closed  securely. The blue and white box has prepaid postage on it. Please place it in the mailbox as  soon as possible. Your physician should have your test results approximately 7 days after the  monitor has been mailed back to [facility].  Call [facility] Customer Care at [phone] if you have questions regarding  your ZIO XT patch monitor. Call them immediately if you see an orange light blinking on your  monitor.  If your monitor falls off in less than 4 days, contact our Monitor department at [phone].  If your monitor becomes loose or falls off after 4 days call [employer] at [phone] for  suggestions on securing your monitor

## 2023-05-19 ENCOUNTER — Encounter: Payer: Self-pay | Admitting: Cardiology

## 2023-05-19 DIAGNOSIS — E069 Thyroiditis, unspecified: Secondary | ICD-10-CM | POA: Diagnosis not present

## 2023-05-19 DIAGNOSIS — R002 Palpitations: Secondary | ICD-10-CM

## 2023-05-19 NOTE — Assessment & Plan Note (Signed)
Palpitations are probably related to her thyroid levels like to see what they are.  Plan: 7 day Zio patch monitor Restart Toprol at 25 mg daily but start once the monitor is turned in.

## 2023-05-19 NOTE — Assessment & Plan Note (Signed)
Thyroiditis seems to have burned-out although her thyroid dosing still seems to be erratic.  She is down to 75 mcg of Synthroid.  However last TSH level was still pretty low.  May be that she needs less aggressive supplementation.

## 2023-05-21 DIAGNOSIS — E039 Hypothyroidism, unspecified: Secondary | ICD-10-CM | POA: Diagnosis not present

## 2023-05-29 DIAGNOSIS — M7989 Other specified soft tissue disorders: Secondary | ICD-10-CM | POA: Diagnosis not present

## 2023-05-29 DIAGNOSIS — M79662 Pain in left lower leg: Secondary | ICD-10-CM | POA: Diagnosis not present

## 2023-05-29 DIAGNOSIS — Z86718 Personal history of other venous thrombosis and embolism: Secondary | ICD-10-CM | POA: Diagnosis not present

## 2023-07-05 NOTE — Progress Notes (Deleted)
{Choose 1 Note Type (Telehealth Visit or Telephone Visit):[id number]}   Patient has given verbal permission to conduct this visit via virtual appointment and to bill insurance 07/05/2023 9:10 PM     Evaluation Performed:  Follow-up visit  Date:  07/05/2023   ID:  Karina Mitchell, Karina Mitchell [birth year], MRN [id number]  {Patient Location:[phone]::"Home"} {Provider Location:[id number]::"Home Office"}  PCP:  Wilfrid Lund, PA  Cardiologist:  Bryan Lemma, MD *** Electrophysiologist:  None   Chief Complaint:   No chief complaint on file.   ====================================  ASSESSMENT & PLAN:    Problem List Items Addressed This Visit   None   ====================================  History of Present Illness:    Karina Mitchell is a 47 y.o. female with PMH notable for *** who presents via audio/video conferencing for a telehealth visit today as a ***.  FORESTINE SECKMAN was last seen ***  Hospitalizations:  ***   Recent - Interim CV studies:   The following studies were reviewed today: ***:  Inerval History   ***  Cardiovascular ROS: {roscv:310661}   ROS:  Please see the history of present illness.     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  related to pregnancy/childbirth ; only was on lovenox and ASA temporarily to reduce risk of clot formation   Hypothyroidism    Following thyroiditis.   Subacute thyroiditis 09/2022   happened within a couple days after flu shot per pt report   Vitamin D deficiency    Past Surgical History:  Procedure Laterality Date   CESAREAN SECTION     x2    CHOLECYSTECTOMY N/A 10/01/2017   Procedure: LAPAROSCOPIC CHOLECYSTECTOMY WITH INTRAOPERATIVE CHOLANGIOGRAM;  Surgeon: Chevis Pretty III, MD;  Location: [facility];  Service: General;  Laterality: N/A;   LAPAROSCOPIC ENDOMETRIOSIS FULGURATION  2007    TUBAL LIGATION       No outpatient medications have been marked as taking for the 07/06/23 encounter (Appointment) with Marykay Lex, MD.     Allergies:   Contrast media [iodinated contrast media], Other, and Shellfish allergy   Social History   Tobacco Use   Smoking status: Never   Smokeless tobacco: Never  Vaping Use   Vaping status: Never Used  Substance Use Topics   Alcohol use: No   Drug use: No     Family Hx: The patient's family history includes Diabetes in her maternal grandmother and paternal grandmother; Hypertension in her father, maternal grandmother, and mother; Stroke in her maternal grandmother and paternal grandmother.   Labs/Other Tests and Data Reviewed:    EKG:  {EKG:[phone]}  Recent Labs: No results found for requested labs within last 365 days.   Recent Lipid Panel No results found for: "CHOL", "TRIG", "HDL", "CHOLHDL", "LDLCALC", "LDLDIRECT"  Wt Readings from Last 3 Encounters:  05/13/23 143 lb 9.6 oz (65.1 kg)  03/05/23 144 lb (65.3 kg)  01/19/23 144 lb (65.3 kg)     Objective:    Vital Signs:  There were no vitals taken for this visit.  {HeartCare Virtual Exam (Optional):[id number]::"VITAL SIGNS:  reviewed"}   ==========================================  COVID-19 Education: The signs and symptoms of COVID-19 were discussed with the patient and how to seek care for testing (follow up with PCP or arrange E-visit).   The importance of social distancing was discussed today.  Time:   Today, I have  spent *** minutes with the patient with telehealth technology discussing the above problems.   An additional ***minutes spent charting (reviewing prior notes, hospital records, studies, labs etc.) Total ***minutes   Medication Adjustments/Labs and Tests Ordered: Current medicines are reviewed at length with the patient today.  Concerns regarding medicines are outlined above.   There are no Patient Instructions on file for this  visit.   Signed, Bryan Lemma, MD  07/05/2023 9:10 PM    [facility]

## 2023-07-05 NOTE — Progress Notes (Deleted)
{  Choose 1 Note Type (Telehealth Visit or Telephone Visit):[phone]}   Patient has given verbal permission to conduct this visit via virtual appointment and to bill insurance 07/05/2023 9:05 PM     Evaluation Performed:  Follow-up visit  Date:  07/05/2023   ID:  Karina Mitchell, Karina Mitchell [birth year], MRN [id number]  {Patient Location:[phone]::"Home"} {Provider Location:[id number]::"Home Office"}  PCP:  Wilfrid Lund, PA  Cardiologist:  Bryan Lemma, MD *** Electrophysiologist:  None   Chief Complaint:   No chief complaint on file.   ====================================  ASSESSMENT & PLAN:    Problem List Items Addressed This Visit   None   ====================================  Objective:       ==========================================  COVID-19 Education: The signs and symptoms of COVID-19 were discussed with the patient and how to seek care for testing (follow up with PCP or arrange E-visit).   The importance of social distancing was discussed today.  Time:   Today, I have spent *** minutes with the patient with telehealth technology discussing the above problems.   An additional ***minutes spent charting (reviewing prior notes, hospital records, studies, labs etc.) Total ***minutes   Medication Adjustments/Labs and Tests Ordered: Current medicines are reviewed at length with the patient today.  Concerns regarding medicines are outlined above.   There are no Patient Instructions on file for this visit.   Signed, Bryan Lemma, MD  07/05/2023 9:05 PM    [facility]    {Are you ordering a CV Procedure (e.g. stress test, cath, DCCV, TEE, etc)?   Press F2        :[id number]}   Dispo: No follow-ups on file.  Total time spent: *** min spent with patient + *** min spent charting = *** min  Signed, Marykay Lex, MD, MS Bryan Lemma, M.D., M.S. Interventional Cardiologist  [facility]  Pager # [phone] Phone #  [phone] [address] [place], [place] [address]

## 2023-07-05 NOTE — Progress Notes (Unsigned)
Virtual Visit via Video Note   Because of Maricela Schreur Ruud's co-morbid illnesses, she is at least at moderate risk for complications without adequate follow up.  This format is felt to be most appropriate for this patient at this time.  All issues noted in this document were discussed and addressed.  A limited physical exam was performed with this format.  Please refer to the patient's chart for her consent to telehealth for [facility].  Patient has given verbal permission to conduct this visit via virtual appointment and to bill insurance 07/05/2023 9:10 PM     Evaluation Performed:  Follow-up visit  Patient Location: Home Provider Location: Office/Clinic  Date:  07/08/2023  ID:  Lillia Pauls, DOB Mar 26, 1976, MRN [id number] PCP: Wilfrid Lund, [place]  [facility] Providers Cardiologist:  Bryan Lemma, MD     Chief Complaint  Patient presents with   Follow-up   Palpitations    Zio Monitor Results.   History of Present Illness: .     SHARDAE KLEINMAN is a 47 y.o. female with a PMH notable for Hyperthyroidism/Thyroiditis who presents here for 19-month follow-up .  KYM SCANNELL was seen on May 13, 2023 at the request of Wilfrid Lund, [place] for evaluation of rapid palpitations-thought to be related to her thyroid levels-levothyroxine doses are being adjusted..  7-day Zio patch monitor ordered.  Continued on 12.5 mg Toprol -> thought was that we could potentially titrate up to full 25 mg.  It was not supposed to start till after monitor.    Subjective  INTERVAL HISTORY Since last visit she has been doing a whole lot better.  She says that she still has palpitations that can come and go but nothing prolonged and nothing that fast.  She says that her most recent thyroid levels are now normal and in that scenario, her palpitations have been pretty much controlled.  She has been very active and exercising more routinely and with that the palpitations are also better.   She denies any chest pain or dyspnea with exertion.  She says that her heart rate recovers nicely after exercise indicating that she is getting in better shape.  She has not actually taken a metoprolol yet.  We talked about the use of PRN.  ROS:  Cardiovascular ROS: no chest pain or dyspnea on exertion positive for - palpitations and much less prominent negative for - edema, orthopnea, paroxysmal nocturnal dyspnea, rapid heart rate, shortness of breath, or lightheadedness, dizziness or wooziness, syncope/near syncope or TIA/amaurosis fugax, claudication Review of Systems - Negative except some anxiety issues.  Working on staying adequately hydrated.    Objective  Studies Reviewed: .       3-day Zio patch (3d 11 Hr) May 2024   Patient was in sinus rhythm with a rate range of 47 147 bpm.  Average 72 bpm.   Very rare isolated PACs and PVCs with no couplets triplets.  No bigeminy or trigeminy.   Patient symptoms were noted with sinus rhythm.   No Non-Sustained or Sustained Arrhythmias: Atrial Tachycardia (AT), Supraventricular Tachycardia (SVT), Atrial Fibrillation (A-Fib), Atrial Flutter (A-Flutter), Sustained Ventricular Tachycardia (VT)  Risk Assessment/Calculations:         Physical Exam:   VS:  Ht 5\' 2"  (1.575 m)   Wt 145 lb (65.8 kg)   BMI 26.52 kg/m    Wt Readings from Last 3 Encounters:  07/06/23 145 lb (65.8 kg)  05/13/23 143 lb 9.6 oz (65.1  kg)  03/05/23 144 lb (65.3 kg)   VITAL SIGNS:  reviewed GEN:  no acute distress RESPIRATORY:  normal respiratory effort, symmetric expansion NEURO:  alert and oriented x 3, no obvious focal deficit PSYCH:  normal affect   ASSESSMENT AND PLAN: .    Problem List Items Addressed This Visit     Thyroiditis - Primary (Chronic)    It appears that her thyroid levels have normalized she is on a stable dose of Synthroid.  As such, her palpitations are much better controlled. I did mention that it is not unusual to feel some palpitations or  irregular heartbeat sensations when doses are being adjusted.      Rapid palpitations    Doing well.  Much less prominent now that her thyroid levels are improved.  She has palpitations off-and-on but nothing prolonged.  Discussed use of PRN dose of Toprol for bad days.  As long she does not do more than 3 to 4 days, she is okay to stop it.  But if she uses more than 4 days with do several days every other day before stopping.           Dispo: Return in about 6 months (around 01/06/2024) for 6 month follow-up with me.  Total time spent: 15 min spent with patient + 11 min spent charting = 26 min    Signed, Marykay Lex, MD, MS Bryan Lemma, M.D., M.S. Interventional Cardiologist  [facility]  Pager # [phone] Phone # [phone] [address] [place], [place] [address]

## 2023-07-06 ENCOUNTER — Encounter: Payer: Self-pay | Admitting: Cardiology

## 2023-07-06 ENCOUNTER — Ambulatory Visit: Payer: Federal, State, Local not specified - PPO | Attending: Cardiology | Admitting: Cardiology

## 2023-07-06 ENCOUNTER — Telehealth: Payer: Self-pay

## 2023-07-06 VITALS — Ht 62.0 in | Wt 145.0 lb

## 2023-07-06 DIAGNOSIS — R002 Palpitations: Secondary | ICD-10-CM

## 2023-07-06 DIAGNOSIS — E069 Thyroiditis, unspecified: Secondary | ICD-10-CM

## 2023-07-06 NOTE — Telephone Encounter (Signed)
  Patient Consent for Virtual Visit        Karina Mitchell has provided verbal consent on 07/06/2023 for a virtual visit (video or telephone).   CONSENT FOR VIRTUAL VISIT FOR:  Karina Mitchell  By participating in this virtual visit I agree to the following:  I hereby voluntarily request, consent and authorize [facility] and its employed or contracted physicians, physician assistants, nurse practitioners or other licensed health care professionals (the Practitioner), to provide me with telemedicine health care services (the "Services") as deemed necessary by the treating Practitioner. I acknowledge and consent to receive the Services by the Practitioner via telemedicine. I understand that the telemedicine visit will involve communicating with the Practitioner through live audiovisual communication technology and the disclosure of certain medical information by electronic transmission. I acknowledge that I have been given the opportunity to request an in-person assessment or other available alternative prior to the telemedicine visit and am voluntarily participating in the telemedicine visit.  I understand that I have the right to withhold or withdraw my consent to the use of telemedicine in the course of my care at any time, without affecting my right to future care or treatment, and that the Practitioner or I may terminate the telemedicine visit at any time. I understand that I have the right to inspect all information obtained and/or recorded in the course of the telemedicine visit and may receive copies of available information for a reasonable fee.  I understand that some of the potential risks of receiving the Services via telemedicine include:  Delay or interruption in medical evaluation due to technological equipment failure or disruption; Information transmitted may not be sufficient (e.g. poor resolution of images) to allow for appropriate medical decision making by the  Practitioner; and/or  In rare instances, security protocols could fail, causing a breach of personal health information.  Furthermore, I acknowledge that it is my responsibility to provide information about my medical history, conditions and care that is complete and accurate to the best of my ability. I acknowledge that Practitioner's advice, recommendations, and/or decision may be based on factors not within their control, such as incomplete or inaccurate data provided by me or distortions of diagnostic images or specimens that may result from electronic transmissions. I understand that the practice of medicine is not an exact science and that Practitioner makes no warranties or guarantees regarding treatment outcomes. I acknowledge that a copy of this consent can be made available to me via my patient portal [facility] MyChart), or I can request a printed copy by calling the office of [facility].    I understand that my insurance will be billed for this visit.   I have read or had this consent read to me. I understand the contents of this consent, which adequately explains the benefits and risks of the Services being provided via telemedicine.  I have been provided ample opportunity to ask questions regarding this consent and the Services and have had my questions answered to my satisfaction. I give my informed consent for the services to be provided through the use of telemedicine in my medical care

## 2023-07-06 NOTE — Patient Instructions (Signed)
Medication Instructions:    *If you need a refill on your cardiac medications before your next appointment, please call your pharmacy*   Lab Work:  If you have labs (blood work) drawn today and your tests are completely normal, you will receive your results only by: MyChart Message (if you have MyChart) OR A paper copy in the mail If you have any lab test that is abnormal or we need to change your treatment, we will call you to review the results.   Testing/Procedures:    Follow-Up: At [facility], you and your health needs are our priority.  As part of our continuing mission to provide you with exceptional heart care, we have created designated Provider Care Teams.  These Care Teams include your primary Cardiologist (physician) and Advanced Practice Providers (APPs -  Physician Assistants and Nurse Practitioners) who all work together to provide you with the care you need, when you need it.     Your next appointment:   {numbers 1-12:10294} {Time; day/wk/mo/yr(s):9076}  The format for your next appointment:   {F/U Visit Format          :[id number]}  Provider:   Bryan Lemma, MD    Other Instructions

## 2023-07-08 ENCOUNTER — Encounter: Payer: Self-pay | Admitting: Cardiology

## 2023-07-08 NOTE — Assessment & Plan Note (Signed)
It appears that her thyroid levels have normalized she is on a stable dose of Synthroid.  As such, her palpitations are much better controlled. I did mention that it is not unusual to feel some palpitations or irregular heartbeat sensations when doses are being adjusted.

## 2023-07-08 NOTE — Assessment & Plan Note (Signed)
Doing well.  Much less prominent now that her thyroid levels are improved.  She has palpitations off-and-on but nothing prolonged.  Discussed use of PRN dose of Toprol for bad days.  As long she does not do more than 3 to 4 days, she is okay to stop it.  But if she uses more than 4 days with do several days every other day before stopping.

## 2023-07-22 DIAGNOSIS — N643 Galactorrhea not associated with childbirth: Secondary | ICD-10-CM | POA: Diagnosis not present

## 2023-07-22 DIAGNOSIS — E039 Hypothyroidism, unspecified: Secondary | ICD-10-CM | POA: Diagnosis not present

## 2023-07-22 DIAGNOSIS — D500 Iron deficiency anemia secondary to blood loss (chronic): Secondary | ICD-10-CM | POA: Diagnosis not present

## 2023-07-22 DIAGNOSIS — M791 Myalgia, unspecified site: Secondary | ICD-10-CM | POA: Diagnosis not present

## 2023-07-28 DIAGNOSIS — L658 Other specified nonscarring hair loss: Secondary | ICD-10-CM | POA: Diagnosis not present

## 2023-07-28 DIAGNOSIS — L814 Other melanin hyperpigmentation: Secondary | ICD-10-CM | POA: Diagnosis not present

## 2023-07-28 DIAGNOSIS — L638 Other alopecia areata: Secondary | ICD-10-CM | POA: Diagnosis not present

## 2023-08-21 DIAGNOSIS — M2550 Pain in unspecified joint: Secondary | ICD-10-CM | POA: Diagnosis not present

## 2023-08-21 DIAGNOSIS — E0590 Thyrotoxicosis, unspecified without thyrotoxic crisis or storm: Secondary | ICD-10-CM | POA: Diagnosis not present

## 2023-08-31 DIAGNOSIS — L2489 Irritant contact dermatitis due to other agents: Secondary | ICD-10-CM | POA: Diagnosis not present

## 2023-08-31 DIAGNOSIS — L638 Other alopecia areata: Secondary | ICD-10-CM | POA: Diagnosis not present

## 2023-09-15 DIAGNOSIS — G514 Facial myokymia: Secondary | ICD-10-CM | POA: Diagnosis not present

## 2023-10-06 DIAGNOSIS — L638 Other alopecia areata: Secondary | ICD-10-CM | POA: Diagnosis not present

## 2023-10-22 DIAGNOSIS — E039 Hypothyroidism, unspecified: Secondary | ICD-10-CM | POA: Diagnosis not present

## 2023-10-22 DIAGNOSIS — R002 Palpitations: Secondary | ICD-10-CM | POA: Diagnosis not present

## 2023-10-22 DIAGNOSIS — E559 Vitamin D deficiency, unspecified: Secondary | ICD-10-CM | POA: Diagnosis not present

## 2023-11-10 DIAGNOSIS — L638 Other alopecia areata: Secondary | ICD-10-CM | POA: Diagnosis not present

## 2023-12-02 DIAGNOSIS — E039 Hypothyroidism, unspecified: Secondary | ICD-10-CM | POA: Diagnosis not present

## 2023-12-02 DIAGNOSIS — Z13220 Encounter for screening for lipoid disorders: Secondary | ICD-10-CM | POA: Diagnosis not present

## 2023-12-02 DIAGNOSIS — Z0000 Encounter for general adult medical examination without abnormal findings: Secondary | ICD-10-CM | POA: Diagnosis not present

## 2023-12-02 DIAGNOSIS — H9201 Otalgia, right ear: Secondary | ICD-10-CM | POA: Diagnosis not present

## 2023-12-02 DIAGNOSIS — R109 Unspecified abdominal pain: Secondary | ICD-10-CM | POA: Diagnosis not present

## 2023-12-21 DIAGNOSIS — L638 Other alopecia areata: Secondary | ICD-10-CM | POA: Diagnosis not present

## 2023-12-29 DIAGNOSIS — E0590 Thyrotoxicosis, unspecified without thyrotoxic crisis or storm: Secondary | ICD-10-CM | POA: Diagnosis not present

## 2024-01-27 DIAGNOSIS — Z6827 Body mass index (BMI) 27.0-27.9, adult: Secondary | ICD-10-CM | POA: Diagnosis not present

## 2024-01-27 DIAGNOSIS — Z01419 Encounter for gynecological examination (general) (routine) without abnormal findings: Secondary | ICD-10-CM | POA: Diagnosis not present

## 2024-01-27 DIAGNOSIS — Z1231 Encounter for screening mammogram for malignant neoplasm of breast: Secondary | ICD-10-CM | POA: Diagnosis not present

## 2024-02-08 DIAGNOSIS — J069 Acute upper respiratory infection, unspecified: Secondary | ICD-10-CM | POA: Diagnosis not present

## 2024-02-08 DIAGNOSIS — J40 Bronchitis, not specified as acute or chronic: Secondary | ICD-10-CM | POA: Diagnosis not present

## 2024-02-13 DIAGNOSIS — R051 Acute cough: Secondary | ICD-10-CM | POA: Diagnosis not present

## 2024-02-13 DIAGNOSIS — J208 Acute bronchitis due to other specified organisms: Secondary | ICD-10-CM | POA: Diagnosis not present

## 2024-02-19 DIAGNOSIS — D500 Iron deficiency anemia secondary to blood loss (chronic): Secondary | ICD-10-CM | POA: Diagnosis not present

## 2024-02-19 DIAGNOSIS — E041 Nontoxic single thyroid nodule: Secondary | ICD-10-CM | POA: Diagnosis not present

## 2024-02-19 DIAGNOSIS — N920 Excessive and frequent menstruation with regular cycle: Secondary | ICD-10-CM | POA: Diagnosis not present

## 2024-02-19 DIAGNOSIS — E039 Hypothyroidism, unspecified: Secondary | ICD-10-CM | POA: Diagnosis not present

## 2024-02-22 DIAGNOSIS — D2372 Other benign neoplasm of skin of left lower limb, including hip: Secondary | ICD-10-CM | POA: Diagnosis not present

## 2024-02-22 DIAGNOSIS — R0781 Pleurodynia: Secondary | ICD-10-CM | POA: Diagnosis not present

## 2024-02-22 DIAGNOSIS — L638 Other alopecia areata: Secondary | ICD-10-CM | POA: Diagnosis not present

## 2024-02-29 ENCOUNTER — Other Ambulatory Visit: Payer: Self-pay | Admitting: Family Medicine

## 2024-02-29 DIAGNOSIS — R0781 Pleurodynia: Secondary | ICD-10-CM

## 2024-03-22 ENCOUNTER — Ambulatory Visit
Admission: RE | Admit: 2024-03-22 | Discharge: 2024-03-22 | Disposition: A | Discharge status: Home / Self Care | Source: Ambulatory Visit | Attending: Family Medicine | Admitting: Family Medicine

## 2024-03-22 DIAGNOSIS — R079 Chest pain, unspecified: Secondary | ICD-10-CM | POA: Diagnosis not present

## 2024-03-22 DIAGNOSIS — R0781 Pleurodynia: Secondary | ICD-10-CM

## 2024-04-15 DIAGNOSIS — M940 Chondrocostal junction syndrome [Tietze]: Secondary | ICD-10-CM | POA: Diagnosis not present

## 2024-05-26 DIAGNOSIS — M25562 Pain in left knee: Secondary | ICD-10-CM | POA: Diagnosis not present

## 2024-06-17 DIAGNOSIS — E041 Nontoxic single thyroid nodule: Secondary | ICD-10-CM | POA: Diagnosis not present

## 2024-06-17 DIAGNOSIS — E7800 Pure hypercholesterolemia, unspecified: Secondary | ICD-10-CM | POA: Diagnosis not present

## 2024-06-17 DIAGNOSIS — E559 Vitamin D deficiency, unspecified: Secondary | ICD-10-CM | POA: Diagnosis not present

## 2024-06-17 DIAGNOSIS — E039 Hypothyroidism, unspecified: Secondary | ICD-10-CM | POA: Diagnosis not present

## 2024-09-30 DIAGNOSIS — H8110 Benign paroxysmal vertigo, unspecified ear: Secondary | ICD-10-CM | POA: Diagnosis not present

## 2024-12-07 DIAGNOSIS — E031 Congenital hypothyroidism without goiter: Secondary | ICD-10-CM | POA: Diagnosis not present

## 2024-12-07 DIAGNOSIS — I959 Hypotension, unspecified: Secondary | ICD-10-CM | POA: Diagnosis not present

## 2024-12-07 DIAGNOSIS — E039 Hypothyroidism, unspecified: Secondary | ICD-10-CM | POA: Diagnosis not present

## 2024-12-07 DIAGNOSIS — E7800 Pure hypercholesterolemia, unspecified: Secondary | ICD-10-CM | POA: Diagnosis not present

## 2024-12-07 DIAGNOSIS — E041 Nontoxic single thyroid nodule: Secondary | ICD-10-CM | POA: Diagnosis not present
